# Patient Record
Sex: Female | Born: 1955 | Race: White | Hispanic: Yes | Marital: Married | Smoking: Never smoker
Health system: Southern US, Community
[De-identification: ages and names within clinical notes are randomized; demographics above are authoritative.]

## PROBLEM LIST (undated history)

## (undated) DIAGNOSIS — E039 Hypothyroidism, unspecified: Secondary | ICD-10-CM

## (undated) DIAGNOSIS — K635 Polyp of colon: Secondary | ICD-10-CM

## (undated) DIAGNOSIS — C55 Malignant neoplasm of uterus, part unspecified: Secondary | ICD-10-CM

## (undated) DIAGNOSIS — D126 Benign neoplasm of colon, unspecified: Secondary | ICD-10-CM

## (undated) DIAGNOSIS — Z5189 Encounter for other specified aftercare: Secondary | ICD-10-CM

## (undated) DIAGNOSIS — M1990 Unspecified osteoarthritis, unspecified site: Secondary | ICD-10-CM

## (undated) HISTORY — PX: ABDOMINAL HYSTERECTOMY: SHX81

## (undated) HISTORY — DX: Unspecified osteoarthritis, unspecified site: M19.90

## (undated) HISTORY — DX: Malignant neoplasm of uterus, part unspecified: C55

## (undated) HISTORY — DX: Hypothyroidism, unspecified: E03.9

## (undated) HISTORY — DX: Polyp of colon: K63.5

## (undated) HISTORY — DX: Encounter for other specified aftercare: Z51.89

## (undated) HISTORY — DX: Benign neoplasm of colon, unspecified: D12.6

---

## 1981-06-27 HISTORY — PX: OTHER SURGICAL HISTORY: SHX169

## 2015-12-25 HISTORY — PX: COLONOSCOPY: SHX174

## 2019-08-16 ENCOUNTER — Other Ambulatory Visit: Payer: Self-pay

## 2019-08-16 ENCOUNTER — Encounter: Payer: Self-pay | Admitting: Family Medicine

## 2019-08-16 ENCOUNTER — Ambulatory Visit (INDEPENDENT_AMBULATORY_CARE_PROVIDER_SITE_OTHER): Payer: BC Managed Care – PPO | Admitting: Family Medicine

## 2019-08-16 VITALS — BP 130/80 | HR 78 | Temp 97.3°F | Ht 64.0 in | Wt 187.8 lb

## 2019-08-16 DIAGNOSIS — Z114 Encounter for screening for human immunodeficiency virus [HIV]: Secondary | ICD-10-CM

## 2019-08-16 DIAGNOSIS — Z8542 Personal history of malignant neoplasm of other parts of uterus: Secondary | ICD-10-CM | POA: Insufficient documentation

## 2019-08-16 DIAGNOSIS — Z0000 Encounter for general adult medical examination without abnormal findings: Secondary | ICD-10-CM | POA: Diagnosis not present

## 2019-08-16 DIAGNOSIS — E038 Other specified hypothyroidism: Secondary | ICD-10-CM

## 2019-08-16 DIAGNOSIS — E039 Hypothyroidism, unspecified: Secondary | ICD-10-CM | POA: Insufficient documentation

## 2019-08-16 DIAGNOSIS — Z1159 Encounter for screening for other viral diseases: Secondary | ICD-10-CM

## 2019-08-16 LAB — COMPREHENSIVE METABOLIC PANEL
ALT: 25 U/L (ref 0–35)
AST: 24 U/L (ref 0–37)
Albumin: 4.6 g/dL (ref 3.5–5.2)
Alkaline Phosphatase: 111 U/L (ref 39–117)
BUN: 17 mg/dL (ref 6–23)
CO2: 28 mEq/L (ref 19–32)
Calcium: 10 mg/dL (ref 8.4–10.5)
Chloride: 103 mEq/L (ref 96–112)
Creatinine, Ser: 0.88 mg/dL (ref 0.40–1.20)
GFR: 65.22 mL/min (ref >60.00)
Glucose, Bld: 94 mg/dL (ref 70–99)
Potassium: 3.9 mEq/L (ref 3.5–5.1)
Sodium: 140 mEq/L (ref 135–145)
Total Bilirubin: 0.6 mg/dL (ref 0.2–1.2)
Total Protein: 7.3 g/dL (ref 6.0–8.3)

## 2019-08-16 LAB — CBC WITH DIFFERENTIAL/PLATELET
Basophils Absolute: 0.1 10*3/uL (ref 0.0–0.1)
Basophils Relative: 0.7 % (ref 0.0–3.0)
Eosinophils Absolute: 0.1 10*3/uL (ref 0.0–0.7)
Eosinophils Relative: 1.3 % (ref 0.0–5.0)
HCT: 45.2 % (ref 36.0–46.0)
Hemoglobin: 14.8 g/dL (ref 12.0–15.0)
Lymphocytes Relative: 25.4 % (ref 12.0–46.0)
Lymphs Abs: 2.3 10*3/uL (ref 0.7–4.0)
MCHC: 32.8 g/dL (ref 30.0–36.0)
MCV: 89.2 fl (ref 78.0–100.0)
Monocytes Absolute: 0.6 10*3/uL (ref 0.1–1.0)
Monocytes Relative: 6.3 % (ref 3.0–12.0)
Neutro Abs: 6.1 10*3/uL (ref 1.4–7.7)
Neutrophils Relative %: 66.3 % (ref 43.0–77.0)
Platelets: 336 10*3/uL (ref 150.0–400.0)
RBC: 5.07 Mil/uL (ref 3.87–5.11)
RDW: 13.4 % (ref 11.5–15.5)
WBC: 9.2 10*3/uL (ref 4.0–10.5)

## 2019-08-16 LAB — VITAMIN D 25 HYDROXY (VIT D DEFICIENCY, FRACTURES): VITD: 35.96 ng/mL (ref 30.00–100.00)

## 2019-08-16 LAB — LIPID PANEL
Cholesterol: 244 mg/dL — ABNORMAL HIGH (ref 0–200)
HDL: 75.7 mg/dL (ref >39.00)
NonHDL: 167.8
Total CHOL/HDL Ratio: 3
Triglycerides: 224 mg/dL — ABNORMAL HIGH (ref 0.0–149.0)
VLDL: 44.8 mg/dL — ABNORMAL HIGH (ref 0.0–40.0)

## 2019-08-16 LAB — LDL CHOLESTEROL, DIRECT: Direct LDL: 130 mg/dL

## 2019-08-16 LAB — T4, FREE: Free T4: 1.16 ng/dL (ref 0.60–1.60)

## 2019-08-16 LAB — TSH: TSH: 1.57 u[IU]/mL (ref 0.35–4.50)

## 2019-08-16 NOTE — Progress Notes (Signed)
Patient: Wendy Payne MRN: [id number] DOB: 07-02-1955 PCP: No primary care provider on file.    Subjective:  Chief Complaint  Patient presents with  . Establish Care    HPI: The patient is a 64 y.o. female who presents today for establishing care. Has past medical history of hypothyroidism and uterine cancer s/p hysterectomy and oophorectomy.  The patient is a 64 year old female who presents today for annual exam. She denies any changes to past medical history. There have been no recent hospitalizations. They are following a well balanced diet and exercise plan. Weight has been stable. No complaints today.   Colonoscopy: 12/25/2015 Mammogram: 08/2018 Pap smear: N/A  Father had colon cancer diagnosed at age 41 years. She is on q 45 years screening. Last cscope was 12/25/2015.   Hypothyroidism: diagnosed at age 4 years. She overall feels good. No weight gain, swelling, dry skin, constipation, problems swallowing. She does have cold intolerance. Will check today. Last checked about one year ago.   Review of Systems  Constitutional: Negative for chills, fatigue and fever.  HENT: Negative for dental problem, ear pain, hearing loss, sinus pressure, sinus pain, sore throat and trouble swallowing.   Eyes: Negative for visual disturbance.  Respiratory: Negative for cough, chest tightness and shortness of breath.   Cardiovascular: Negative for chest pain, palpitations and leg swelling.  Gastrointestinal: Negative for abdominal pain, blood in stool, diarrhea and nausea.  Endocrine: Negative for cold intolerance, polydipsia, polyphagia and polyuria.  Genitourinary: Negative for dysuria, flank pain, frequency, hematuria and urgency.  Musculoskeletal: Negative for arthralgias.  Skin: Negative for color change, pallor and rash.  Neurological: Negative for dizziness, light-headedness and headaches.  Psychiatric/Behavioral: Negative for dysphoric mood and sleep disturbance. The patient is not  nervous/anxious.     Allergies Patient has No Known Allergies.  Past Medical History Patient  has a past medical history of Hypothyroidism and Uterine cancer ([place]).  Surgical History Patient  has a past surgical history that includes hysterectomy and oopherectomy  (Bilateral).  Family History Pateint's family history is not on file.  Social History Patient  reports that she has never smoked. She has never used smokeless tobacco. She reports that she does not drink alcohol or use drugs.    Objective: Vitals:   08/16/19 1003  BP: 130/80  Pulse: 78  Temp: (!) 97.3 F (36.3 C)  TempSrc: Temporal  SpO2: 95%  Weight: 187 lb 12.8 oz (85.2 kg)  Height: 5\' 4"  (1.626 m)    Body mass index is 32.24 kg/m.  Physical Exam Vitals reviewed.  Constitutional:      Appearance: Normal appearance. She is well-developed. She is obese.  HENT:     Head: Normocephalic and atraumatic.     Right Ear: Tympanic membrane, ear canal and external ear normal.     Left Ear: Tympanic membrane, ear canal and external ear normal.     Nose: Nose normal.     Mouth/Throat:     Mouth: Mucous membranes are moist.  Eyes:     Extraocular Movements: Extraocular movements intact.     Conjunctiva/sclera: Conjunctivae normal.     Pupils: Pupils are equal, round, and reactive to light.  Neck:     Thyroid: No thyromegaly.     Vascular: No carotid bruit.  Cardiovascular:     Rate and Rhythm: Normal rate and regular rhythm.     Pulses: Normal pulses.     Heart sounds: Normal heart sounds. No murmur.  Pulmonary:  Effort: Pulmonary effort is normal.     Breath sounds: Normal breath sounds.  Abdominal:     General: Abdomen is flat. Bowel sounds are normal. There is no distension.     Palpations: Abdomen is soft.     Tenderness: There is no abdominal tenderness.  Musculoskeletal:        General: No swelling. Normal range of motion.     Cervical back: Normal range of motion and neck supple.   Lymphadenopathy:     Cervical: No cervical adenopathy.  Skin:    General: Skin is warm and dry.     Capillary Refill: Capillary refill takes less than 2 seconds.     Findings: No rash.  Neurological:     General: No focal deficit present.     Mental Status: She is alert and oriented to person, place, and time.     Cranial Nerves: No cranial nerve deficit.     Coordination: Coordination normal.     Deep Tendon Reflexes: Reflexes normal.  Psychiatric:        Mood and Affect: Mood normal.        Behavior: Behavior normal.      Office Visit from 08/16/2019 in [facility]  PHQ-2 Total Score  0         Assessment/plan: 1. Annual physical exam Routine labs today, she is not fasting, but fine to do. UTD on HM except for mmg and information given on this today. Lots of cancer in family and discussed genetic counseling. She may consider this summer. Encouraged exercise, weight loss. Doing well. F/u in one year or as needed.  Patient counseling [x]    Nutrition: Stressed importance of moderation in sodium/caffeine intake, saturated fat and cholesterol, caloric balance, sufficient intake of fresh fruits, vegetables, fiber, calcium, iron, and 1 mg of folate supplement per day (for females capable of pregnancy).  [x]    Stressed the importance of regular exercise.   []    Substance Abuse: Discussed cessation/primary prevention of tobacco, alcohol, or other drug use; driving or other dangerous activities under the influence; availability of treatment for abuse.   [x]    Injury prevention: Discussed safety belts, safety helmets, smoke detector, smoking near bedding or upholstery.   [x]    Sexuality: Discussed sexually transmitted diseases, partner selection, use of condoms, avoidance of unintended pregnancy  and contraceptive alternatives.  [x]    Dental health: Discussed importance of regular tooth brushing, flossing, and dental visits.  [x]    Health maintenance and immunizations  reviewed. Please refer to Health maintenance section.    - Comprehensive metabolic panel - CBC with Differential/Platelet - VITAMIN D 25 Hydroxy (Vit-D Deficiency, Fractures) - Lipid panel  2. Other specified hypothyroidism Will refill meds after labs are back.  - T4, free - TSH  3. Encounter for screening for HIV  - HIV Antibody (routine testing w rflx)  4. Encounter for hepatitis C screening test for low risk patient  - Hepatitis C antibody   This visit occurred during the SARS-CoV-2 public health emergency.  Safety protocols were in place, including screening questions prior to the visit, additional usage of staff PPE, and extensive cleaning of exam room while observing appropriate contact time as indicated for disinfecting solutions.    Return in about 1 year (around 08/15/2020).   Orma Flaming, MD [place]    08/16/2019

## 2019-08-16 NOTE — Patient Instructions (Signed)

## 2019-08-19 ENCOUNTER — Encounter: Payer: Self-pay | Admitting: Family Medicine

## 2019-08-19 ENCOUNTER — Other Ambulatory Visit: Payer: Self-pay | Admitting: Family Medicine

## 2019-08-19 ENCOUNTER — Telehealth: Payer: Self-pay | Admitting: Family Medicine

## 2019-08-19 DIAGNOSIS — E785 Hyperlipidemia, unspecified: Secondary | ICD-10-CM | POA: Insufficient documentation

## 2019-08-19 LAB — HEPATITIS C ANTIBODY
Hepatitis C Ab: NONREACTIVE
SIGNAL TO CUT-OFF: 0.03 (ref <1.00)

## 2019-08-19 LAB — HIV ANTIBODY (ROUTINE TESTING W REFLEX): HIV 1&2 Ab, 4th Generation: NONREACTIVE

## 2019-08-19 MED ORDER — LEVOTHYROXINE SODIUM 112 MCG PO TABS: 112.0000 ug | ORAL_TABLET | Freq: Every day | ORAL | 3 refills | Status: DC

## 2019-08-19 NOTE — Telephone Encounter (Signed)
Patient calling back about lab results.  °

## 2019-08-20 ENCOUNTER — Encounter: Payer: Self-pay | Admitting: Emergency Medicine

## 2019-08-20 NOTE — Telephone Encounter (Signed)
Spoke with pt to give results.

## 2019-08-21 ENCOUNTER — Ambulatory Visit: Payer: Self-pay | Admitting: Family Medicine

## 2019-08-26 ENCOUNTER — Encounter: Payer: Self-pay | Admitting: Family Medicine

## 2019-10-03 ENCOUNTER — Ambulatory Visit: Payer: BC Managed Care – PPO | Attending: Internal Medicine

## 2019-10-03 DIAGNOSIS — Z23 Encounter for immunization: Secondary | ICD-10-CM

## 2019-10-03 NOTE — Progress Notes (Signed)
   Covid-19 Vaccination Clinic  Name:  Wendy Payne    MRN: [id number] DOB: 15-Mar-1956  10/03/2019  [name] was observed post Covid-19 immunization for 15 minutes without incident. She was provided with Vaccine Information Sheet and instruction to access the V-Safe system.   [name] was instructed to call 911 with any severe reactions post vaccine: Marland Kitchen Difficulty breathing  . Swelling of face and throat  . A fast heartbeat  . A bad rash all over body  . Dizziness and weakness   Immunizations Administered    Name Date Dose VIS Date Route   Pfizer COVID-19 Vaccine 10/03/2019  2:01 PM 0.3 mL 06/07/2019 Intramuscular   Manufacturer: [place]   Lot: [id number]   [place]: [id number]

## 2019-10-28 ENCOUNTER — Ambulatory Visit: Payer: BC Managed Care – PPO | Attending: Internal Medicine

## 2019-10-28 DIAGNOSIS — Z23 Encounter for immunization: Secondary | ICD-10-CM

## 2019-10-28 NOTE — Progress Notes (Signed)
   Covid-19 Vaccination Clinic  Name:  Wendy Payne    MRN: [id number] DOB: November 29, 1955  10/28/2019  Ms. Money was observed post Covid-19 immunization for 15 minutes without incident. She was provided with Vaccine Information Sheet and instruction to access the V-Safe system.   [name] was instructed to call 911 with any severe reactions post vaccine: Marland Kitchen Difficulty breathing  . Swelling of face and throat  . A fast heartbeat  . A bad rash all over body  . Dizziness and weakness   Immunizations Administered    Name Date Dose VIS Date Route   Pfizer COVID-19 Vaccine 10/28/2019 10:42 AM 0.3 mL 08/21/2018 Intramuscular   Manufacturer: [place]   Lot: [id number]   [place]: [id number]

## 2020-01-28 ENCOUNTER — Other Ambulatory Visit: Payer: Self-pay

## 2020-01-28 ENCOUNTER — Telehealth: Payer: Self-pay | Admitting: Family Medicine

## 2020-01-28 NOTE — Telephone Encounter (Signed)
Levothyroxine is the generic correct?    Refill?

## 2020-01-28 NOTE — Telephone Encounter (Signed)
Patient is asking for a generic version of her levothyroxine (SYNTHROID) 112 MCG tablet, states that its too expensive -[facility]: [facility] :Address: [address], [place], [place] [address], Phone: [phone]

## 2020-01-29 NOTE — Telephone Encounter (Signed)
Lvm for pt to cal the office back.

## 2020-01-29 NOTE — Telephone Encounter (Signed)
Levothyroxine is the generic. She may need to try goodrx or ask pharmacist if there is something cheaper. That is the only generic that I am aware of.   Dr. Rogers Blocker

## 2020-01-31 MED ORDER — LEVOTHYROXINE SODIUM 112 MCG PO TABS: 112.0000 ug | ORAL_TABLET | Freq: Every day | ORAL | 3 refills | Status: DC

## 2020-01-31 NOTE — Telephone Encounter (Signed)
levothyroxine (SYNTHROID) 112 MCG tablet [[id number]]   Can this be sent to the [facility] on [address] please?

## 2020-01-31 NOTE — Addendum Note (Signed)
Addended by: Orma Flaming on: 01/31/2020 02:07 PM   Modules accepted: Orders

## 2020-01-31 NOTE — Telephone Encounter (Signed)
Relayed Dr. Shelby Mattocks message to PT. She is going to go pick up prescription now from pharmacy.

## 2020-02-12 ENCOUNTER — Encounter: Payer: Self-pay | Admitting: Family Medicine

## 2020-02-12 MED ORDER — LEVOTHYROXINE SODIUM 112 MCG PO TABS: 112.0000 ug | ORAL_TABLET | Freq: Every day | ORAL | 3 refills | Status: DC

## 2020-02-12 NOTE — Telephone Encounter (Signed)
FYI

## 2020-04-24 ENCOUNTER — Other Ambulatory Visit: Payer: Self-pay | Admitting: Family Medicine

## 2020-04-24 MED ORDER — LEVOTHYROXINE SODIUM 112 MCG PO TABS: 112.0000 ug | ORAL_TABLET | Freq: Every day | ORAL | 3 refills | Status: DC

## 2020-05-25 ENCOUNTER — Other Ambulatory Visit: Payer: Self-pay | Admitting: Family Medicine

## 2020-05-25 DIAGNOSIS — Z1231 Encounter for screening mammogram for malignant neoplasm of breast: Secondary | ICD-10-CM

## 2020-07-07 ENCOUNTER — Ambulatory Visit: Payer: BC Managed Care – PPO

## 2020-08-11 ENCOUNTER — Other Ambulatory Visit: Payer: Self-pay

## 2020-08-11 ENCOUNTER — Ambulatory Visit
Admission: RE | Admit: 2020-08-11 | Discharge: 2020-08-11 | Disposition: A | Discharge status: Home / Self Care | Payer: BC Managed Care – PPO | Source: Ambulatory Visit | Attending: Family Medicine | Admitting: Family Medicine

## 2020-08-11 DIAGNOSIS — Z1231 Encounter for screening mammogram for malignant neoplasm of breast: Secondary | ICD-10-CM

## 2020-08-17 ENCOUNTER — Encounter: Payer: Self-pay | Admitting: Family Medicine

## 2020-08-17 ENCOUNTER — Ambulatory Visit (INDEPENDENT_AMBULATORY_CARE_PROVIDER_SITE_OTHER): Payer: BC Managed Care – PPO | Admitting: Family Medicine

## 2020-08-17 ENCOUNTER — Other Ambulatory Visit: Payer: Self-pay

## 2020-08-17 VITALS — BP 108/72 | HR 67 | Temp 97.9°F | Ht 64.0 in | Wt 188.6 lb

## 2020-08-17 DIAGNOSIS — E038 Other specified hypothyroidism: Secondary | ICD-10-CM

## 2020-08-17 DIAGNOSIS — Z0000 Encounter for general adult medical examination without abnormal findings: Secondary | ICD-10-CM | POA: Diagnosis not present

## 2020-08-17 DIAGNOSIS — Z800 Family history of malignant neoplasm of digestive organs: Secondary | ICD-10-CM

## 2020-08-17 DIAGNOSIS — E782 Mixed hyperlipidemia: Secondary | ICD-10-CM | POA: Diagnosis not present

## 2020-08-17 LAB — CBC WITH DIFFERENTIAL/PLATELET
Basophils Absolute: 0.1 10*3/uL (ref 0.0–0.1)
Basophils Relative: 0.6 % (ref 0.0–3.0)
Eosinophils Absolute: 0.1 10*3/uL (ref 0.0–0.7)
Eosinophils Relative: 1.1 % (ref 0.0–5.0)
HCT: 44 % (ref 36.0–46.0)
Hemoglobin: 14.8 g/dL (ref 12.0–15.0)
Lymphocytes Relative: 23.9 % (ref 12.0–46.0)
Lymphs Abs: 2.3 10*3/uL (ref 0.7–4.0)
MCHC: 33.7 g/dL (ref 30.0–36.0)
MCV: 87.5 fl (ref 78.0–100.0)
Monocytes Absolute: 0.6 10*3/uL (ref 0.1–1.0)
Monocytes Relative: 6.8 % (ref 3.0–12.0)
Neutro Abs: 6.4 10*3/uL (ref 1.4–7.7)
Neutrophils Relative %: 67.6 % (ref 43.0–77.0)
Platelets: 306 10*3/uL (ref 150.0–400.0)
RBC: 5.03 Mil/uL (ref 3.87–5.11)
RDW: 13.5 % (ref 11.5–15.5)
WBC: 9.5 10*3/uL (ref 4.0–10.5)

## 2020-08-17 LAB — TSH: TSH: 4.03 u[IU]/mL (ref 0.35–4.50)

## 2020-08-17 LAB — COMPREHENSIVE METABOLIC PANEL
ALT: 18 U/L (ref 0–35)
AST: 21 U/L (ref 0–37)
Albumin: 4.7 g/dL (ref 3.5–5.2)
Alkaline Phosphatase: 94 U/L (ref 39–117)
BUN: 15 mg/dL (ref 6–23)
CO2: 28 mEq/L (ref 19–32)
Calcium: 9.9 mg/dL (ref 8.4–10.5)
Chloride: 102 mEq/L (ref 96–112)
Creatinine, Ser: 0.82 mg/dL (ref 0.40–1.20)
GFR: 76.62 mL/min (ref >60.00)
Glucose, Bld: 95 mg/dL (ref 70–99)
Potassium: 4.5 mEq/L (ref 3.5–5.1)
Sodium: 140 mEq/L (ref 135–145)
Total Bilirubin: 0.6 mg/dL (ref 0.2–1.2)
Total Protein: 7.7 g/dL (ref 6.0–8.3)

## 2020-08-17 LAB — LIPID PANEL
Cholesterol: 225 mg/dL — ABNORMAL HIGH (ref 0–200)
HDL: 72.3 mg/dL (ref >39.00)
LDL Cholesterol: 120 mg/dL — ABNORMAL HIGH (ref 0–99)
NonHDL: 152.21
Total CHOL/HDL Ratio: 3
Triglycerides: 159 mg/dL — ABNORMAL HIGH (ref 0.0–149.0)
VLDL: 31.8 mg/dL (ref 0.0–40.0)

## 2020-08-17 LAB — T4, FREE: Free T4: 0.95 ng/dL (ref 0.60–1.60)

## 2020-08-17 MED ORDER — PHENTERMINE HCL 37.5 MG PO CAPS: 37.5000 mg | ORAL_CAPSULE | ORAL | 1 refills | Status: DC

## 2020-08-17 NOTE — Progress Notes (Signed)
vb v Patient: Wendy Payne MRN: [id number] DOB: [birth year] PCP: Orma Flaming, MD     Subjective:  Chief Complaint  Patient presents with  . Annual Exam  . Hyperlipidemia  . Hypothyroidism    HPI: The patient is a 65 y.o. female who presents today for annual exam. She denies any changes to past medical history. There have been no recent hospitalizations. They are following a well balanced diet and exercise plan. Weight has been increasing steadily. She says that [facility] just joined [facility] [employer], and [facility] 5 days weekly. She would like to discuss Cholesterol levels. She is also fasting.   She has been trying to lose weight and was on phentermine for a short period of time. She is interested in getting back on this for a short amount of time. She had no side effects on it at all.   Hyperlipidemia -she has no family hx of heart attack or stroke. She does not smoke or drink. She is very fit and healthy. ASCVD risk was 3.5%. Her HDL was 75. Discussed treatment guidelines.   Immunization History  Administered Date(s) Administered  . PFIZER(Purple Top)SARS-COV-2 Vaccination 10/03/2019, 10/28/2019, 06/11/2020   Colonoscopy: 12/25/2015. Due this year.  Mammogram: 08/11/2020 Pap smear: NA  Tdap: she thinks within 10 years.   Review of Systems  Constitutional: Negative for chills, fatigue and fever.  HENT: Negative for dental problem, ear pain, hearing loss, sore throat and trouble swallowing.   Eyes: Negative for visual disturbance.  Respiratory: Negative for cough, chest tightness and shortness of breath.   Cardiovascular: Negative for chest pain, palpitations and leg swelling.  Gastrointestinal: Negative for abdominal pain, blood in stool, diarrhea and nausea.  Endocrine: Negative for cold intolerance, polydipsia, polyphagia and polyuria.  Genitourinary: Negative for dysuria and hematuria.  Musculoskeletal: Negative for arthralgias.  Skin: Negative for rash.  Neurological: Negative for  dizziness and headaches.  Psychiatric/Behavioral: Negative for dysphoric mood and sleep disturbance. The patient is not nervous/anxious.     Allergies Patient has No Known Allergies.  Past Medical History Patient  has a past medical history of Colon polyps, Hypothyroidism, and Uterine cancer ([place]).  Surgical History Patient  has a past surgical history that includes hysterectomy and oopherectomy  (Bilateral, 1983).  Family History Pateint's family history includes BRCA 1/2 in her cousin and cousin; Cancer in her father and mother; Kidney disease in her mother.  Social History Patient  reports that she has never smoked. She has never used smokeless tobacco. She reports that she does not drink alcohol and does not use drugs.    Objective: Vitals:   08/17/20 1000  BP: 108/72  Pulse: 67  Temp: 97.9 F (36.6 C)  TempSrc: Temporal  SpO2: 97%  Weight: 188 lb 9.6 oz (85.5 kg)  Height: '5\' 4"'  (1.626 m)    Body mass index is 32.37 kg/m.  Physical Exam Vitals reviewed.  Constitutional:      Appearance: Normal appearance. She is well-developed and well-nourished. She is obese.  HENT:     Head: Normocephalic and atraumatic.     Right Ear: Tympanic membrane, ear canal and external ear normal.     Left Ear: Tympanic membrane, ear canal and external ear normal.     Nose: Nose normal.     Mouth/Throat:     Mouth: Oropharynx is clear and moist. Mucous membranes are moist.  Eyes:     Extraocular Movements: EOM normal.     Conjunctiva/sclera: Conjunctivae normal.     Pupils: Pupils are  equal, round, and reactive to light.  Neck:     Thyroid: No thyromegaly.  Cardiovascular:     Rate and Rhythm: Normal rate and regular rhythm.     Pulses: Intact distal pulses.     Heart sounds: Normal heart sounds. No murmur heard.   Pulmonary:     Effort: Pulmonary effort is normal.     Breath sounds: Normal breath sounds.  Abdominal:     General: Bowel sounds are normal. There is no  distension.     Palpations: Abdomen is soft.     Tenderness: There is no abdominal tenderness.  Musculoskeletal:     Cervical back: Normal range of motion and neck supple.  Lymphadenopathy:     Cervical: No cervical adenopathy.  Skin:    General: Skin is warm and dry.     Capillary Refill: Capillary refill takes less than 2 seconds.     Findings: No rash.  Neurological:     General: No focal deficit present.     Mental Status: She is alert and oriented to person, place, and time.     Cranial Nerves: No cranial nerve deficit.     Coordination: Coordination normal.     Deep Tendon Reflexes: Reflexes normal.  Psychiatric:        Mood and Affect: Mood and affect and mood normal.        Behavior: Behavior normal.    [facility] Office Visit from 08/17/2020 in [facility]  PHQ-2 Total Score 0         Assessment/plan: 1. Annual physical exam Routine fasting labs today. She is UTD on her HM. Asked her to find tdap vaccine. She is very healthy and working - CBC with Differential/Platelet - Comprehensive metabolic panel  2. Mixed hyperlipidemia ascvd risk was 3.5% last year. Discussed guidelines and will check to see where she is out. Doubt she will need medication and she would like to stay off if possible.  - Lipid panel  3. Other specified hypothyroidism  - T4, free - TSH  4. Family history of colon cancer in father Colonoscopies q 5 years. Due this summer so referral done.  - Ambulatory referral to Gastroenterology  Will do phentermine for her. No contraindications. Has tolerated well in the past. No longer than 6 months.   This visit occurred during the SARS-CoV-2 public health emergency.  Safety protocols were in place, including screening questions prior to the visit, additional usage of staff PPE, and extensive cleaning of exam room while observing appropriate contact time as indicated for disinfecting solutions.     Return in about 1 year  (around 08/17/2021) for annual/thyroid/chol .     Orma Flaming, MD [place]  08/17/2020

## 2020-08-17 NOTE — Patient Instructions (Signed)
-look for tdap shot for me. Just need date.  -sent in phenteramine for you to take. No longer than 6 months.  -routine fasting labs -you look great! So good to see you!     Preventive Care 52-63 Years Old, Female Preventive care refers to lifestyle choices and visits with your health care provider that can promote health and wellness. This includes:  A yearly physical exam. This is also called an annual wellness visit.  Regular dental and eye exams.  Immunizations.  Screening for certain conditions.  Healthy lifestyle choices, such as: ? Eating a healthy diet. ? Getting regular exercise. ? Not using drugs or products that contain nicotine and tobacco. ? Limiting alcohol use. What can I expect for my preventive care visit? Physical exam Your health care provider will check your:  Height and weight. These may be used to calculate your BMI (body mass index). BMI is a measurement that tells if you are at a healthy weight.  Heart rate and blood pressure.  Body temperature.  Skin for abnormal spots. Counseling Your health care provider may ask you questions about your:  Past medical problems.  Family's medical history.  Alcohol, tobacco, and drug use.  Emotional well-being.  Home life and relationship well-being.  Sexual activity.  Diet, exercise, and sleep habits.  Work and work Statistician.  Access to firearms.  Method of birth control.  Menstrual cycle.  Pregnancy history. What immunizations do I need? Vaccines are usually given at various ages, according to a schedule. Your health care provider will recommend vaccines for you based on your age, medical history, and lifestyle or other factors, such as travel or where you work.   What tests do I need? Blood tests  Lipid and cholesterol levels. These may be checked every 5 years, or more often if you are over 44 years old.  Hepatitis C test.  Hepatitis B test. Screening  Lung cancer screening. You  may have this screening every year starting at age 52 if you have a 30-pack-year history of smoking and currently smoke or have quit within the past 15 years.  Colorectal cancer screening. ? All adults should have this screening starting at age 56 and continuing until age 19. ? Your health care provider may recommend screening at age 62 if you are at increased risk. ? You will have tests every 1-10 years, depending on your results and the type of screening test.  Diabetes screening. ? This is done by checking your blood sugar (glucose) after you have not eaten for a while (fasting). ? You may have this done every 1-3 years.  Mammogram. ? This may be done every 1-2 years. ? Talk with your health care provider about when you should start having regular mammograms. This may depend on whether you have a family history of breast cancer.  BRCA-related cancer screening. This may be done if you have a family history of breast, ovarian, tubal, or peritoneal cancers.  Pelvic exam and Pap test. ? This may be done every 3 years starting at age 40. ? Starting at age 34, this may be done every 5 years if you have a Pap test in combination with an HPV test. Other tests  STD (sexually transmitted disease) testing, if you are at risk.  Bone density scan. This is done to screen for osteoporosis. You may have this scan if you are at high risk for osteoporosis. Talk with your health care provider about your test results, treatment options, and if  necessary, the need for more tests. Follow these instructions at home: Eating and drinking  Eat a diet that includes fresh fruits and vegetables, whole grains, lean protein, and low-fat dairy products.  Take vitamin and mineral supplements as recommended by your health care provider.  Do not drink alcohol if: ? Your health care provider tells you not to drink. ? You are pregnant, may be pregnant, or are planning to become pregnant.  If you drink  alcohol: ? Limit how much you have to 0-1 drink a day. ? Be aware of how much alcohol is in your drink. In the [place] one drink equals one 12 oz bottle of beer (355 mL), one 5 oz glass of wine (148 mL), or one 1 oz glass of hard liquor (44 mL).   Lifestyle  Take daily care of your teeth and gums. Brush your teeth every morning and night with fluoride toothpaste. Floss one time each day.  Stay active. Exercise for at least 30 minutes 5 or more days each week.  Do not use any products that contain nicotine or tobacco, such as cigarettes, e-cigarettes, and chewing tobacco. If you need help quitting, ask your health care provider.  Do not use drugs.  If you are sexually active, practice safe sex. Use a condom or other form of protection to prevent STIs (sexually transmitted infections).  If you do not wish to become pregnant, use a form of birth control. If you plan to become pregnant, see your health care provider for a prepregnancy visit.  If told by your health care provider, take low-dose aspirin daily starting at age 20.  Find healthy ways to cope with stress, such as: ? Meditation, yoga, or listening to music. ? Journaling. ? Talking to a trusted person. ? Spending time with friends and family. Safety  Always wear your seat belt while driving or riding in a vehicle.  Do not drive: ? If you have been drinking alcohol. Do not ride with someone who has been drinking. ? When you are tired or distracted. ? While texting.  Wear a helmet and other protective equipment during sports activities.  If you have firearms in your house, make sure you follow all gun safety procedures. What's next?  Visit your health care provider once a year for an annual wellness visit.  Ask your health care provider how often you should have your eyes and teeth checked.  Stay up to date on all vaccines. This information is not intended to replace advice given to you by your health care provider. Make  sure you discuss any questions you have with your health care provider. Document Revised: 03/17/2020 Document Reviewed: 02/22/2018 Elsevier Patient Education  2021 [employer].

## 2020-08-19 ENCOUNTER — Telehealth: Payer: Self-pay

## 2020-08-19 MED ORDER — PHENTERMINE HCL 37.5 MG PO TABS: 37.5000 mg | ORAL_TABLET | Freq: Every day | ORAL | 1 refills | Status: DC

## 2020-08-19 NOTE — Telephone Encounter (Signed)
Sent in tablets.  Orma Flaming, MD [place]

## 2020-08-19 NOTE — Telephone Encounter (Signed)
Patient is requesting pills instead of capsules regarding phentermine 37.5 MG capsule can we send in RX for pills instead of capsules  Please advise  tHank you

## 2020-08-28 ENCOUNTER — Telehealth: Payer: Self-pay | Admitting: Gastroenterology

## 2020-08-28 NOTE — Telephone Encounter (Signed)
Hi Dr. Ardis Hughs, we have received a referral from patient's PCP for a repeat colon. Patient had colon in 2017. Records have been received and they will be sent to you for review. Please advise on scheduling. Thank you.

## 2020-08-31 NOTE — Telephone Encounter (Signed)
I reviewed outside imaging.  Colonoscopy June 2017, Dr. Jannetta Quint in [place].  Indication "colon cancer in father before age 63, personal history of colon polyps, unable to locate last colonoscopy report".  Findings 3 mm polyp in the cecum was found and removed with forceps.  The examination was otherwise normal.  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   Can you please let her know that I reviewed her 2017 colonoscopy report and given her family history of colon cancer she needs repeat colonoscopy June 2022.

## 2020-08-31 NOTE — Telephone Encounter (Signed)
Called pt to inform. She will contact us in April to schedule for June procedure.

## 2020-09-29 ENCOUNTER — Encounter: Payer: Self-pay | Admitting: Gastroenterology

## 2020-10-26 ENCOUNTER — Encounter: Payer: Self-pay | Admitting: Family Medicine

## 2020-10-29 MED ORDER — PHENTERMINE HCL 37.5 MG PO TABS: 37.5000 mg | ORAL_TABLET | Freq: Every day | ORAL | 1 refills | Status: DC

## 2020-10-29 NOTE — Telephone Encounter (Signed)
Pt called following up on this message. Please advise.  

## 2020-12-22 ENCOUNTER — Encounter: Payer: BC Managed Care – PPO | Admitting: Gastroenterology

## 2021-02-28 ENCOUNTER — Other Ambulatory Visit: Payer: Self-pay | Admitting: Family Medicine

## 2021-03-08 ENCOUNTER — Other Ambulatory Visit: Payer: Self-pay

## 2021-03-08 ENCOUNTER — Ambulatory Visit (AMBULATORY_SURGERY_CENTER): Payer: Self-pay | Admitting: *Deleted

## 2021-03-08 VITALS — Ht 64.0 in | Wt 180.0 lb

## 2021-03-08 DIAGNOSIS — Z86010 Personal history of colonic polyps: Secondary | ICD-10-CM

## 2021-03-08 DIAGNOSIS — Z800 Family history of malignant neoplasm of digestive organs: Secondary | ICD-10-CM

## 2021-03-08 MED ORDER — NA SULFATE-K SULFATE-MG SULF 17.5-3.13-1.6 GM/177ML PO SOLN: 1.0000 | ORAL | 0 refills | Status: DC

## 2021-03-08 NOTE — Progress Notes (Signed)
Patient is here in-person for PV. Patient denies any allergies to eggs or soy. Patient denies any problems with anesthesia/sedation. Patient is not on any oxygen at home. Patient is not taking any diet/weight loss medications or blood thinners. Patient is aware of our care-partner policy and 0000000 safety protocol.   EMMI education assigned to the patient for the procedure, sent to [facility].   Patient is COVID-19 vaccinated. Patient has constipation-she will use miralax daily and the suprep-pt aware of the cost.

## 2021-03-19 ENCOUNTER — Other Ambulatory Visit: Payer: Self-pay

## 2021-03-19 ENCOUNTER — Encounter: Payer: Self-pay | Admitting: Gastroenterology

## 2021-03-19 ENCOUNTER — Ambulatory Visit (AMBULATORY_SURGERY_CENTER): Payer: BC Managed Care – PPO | Admitting: Gastroenterology

## 2021-03-19 VITALS — BP 128/62 | HR 60 | Temp 97.8°F | Resp 15 | Ht 64.0 in | Wt 180.0 lb

## 2021-03-19 DIAGNOSIS — D124 Benign neoplasm of descending colon: Secondary | ICD-10-CM | POA: Diagnosis not present

## 2021-03-19 DIAGNOSIS — Z800 Family history of malignant neoplasm of digestive organs: Secondary | ICD-10-CM

## 2021-03-19 DIAGNOSIS — D123 Benign neoplasm of transverse colon: Secondary | ICD-10-CM

## 2021-03-19 DIAGNOSIS — Z86010 Personal history of colonic polyps: Secondary | ICD-10-CM | POA: Diagnosis present

## 2021-03-19 MED ORDER — SODIUM CHLORIDE 0.9 % IV SOLN
500.0000 mL | Freq: Once | INTRAVENOUS | Status: DC
Start: 2021-03-19 — End: 2023-05-29

## 2021-03-19 NOTE — Op Note (Signed)
[place] Patient Name: Wendy Payne Procedure Date: 03/19/2021 8:46 AM MRN: [id number] Endoscopist: Milus Banister , MD Age: 65 Referring MD:  Date of Birth: Feb 09, 1956 Gender: Female Account #: [id number] Procedure:                Colonoscopy Indications:              High risk colon cancer surveillance: Personal                            history of colonic polyps; Father with CRC in his                            66s, niece with CRC; Colonoscopy June 2017, Dr.                            Jannetta Quint in [place].                            Indication "colon cancer in father before age 39,                            personal history of colon polyps, unable to locate                            last colonoscopy report". Findings 3 mm polyp in                            the cecum was found and removed with forceps. The                            examination was otherwise normal. The polyp was                            proven to be a tubular adenoma on pathology without                            high-grade dysplasia. Medicines:                Monitored Anesthesia Care Procedure:                Pre-Anesthesia Assessment:                           - Prior to the procedure, a History and Physical                            was performed, and patient medications and                            allergies were reviewed. The patient's tolerance of                            previous anesthesia was also reviewed. The risks  and benefits of the procedure and the sedation                            options and risks were discussed with the patient.                            All questions were answered, and informed consent                            was obtained. Prior Anticoagulants: The patient has                            taken no previous anticoagulant or antiplatelet                            agents. ASA Grade Assessment: II  - A patient with                            mild systemic disease. After reviewing the risks                            and benefits, the patient was deemed in                            satisfactory condition to undergo the procedure.                           After obtaining informed consent, the colonoscope                            was passed under direct vision. Throughout the                            procedure, the patient's blood pressure, pulse, and                            oxygen saturations were monitored continuously. The                            PCF-HQ190L Colonoscope was introduced through the                            anus and advanced to the the cecum, identified by                            appendiceal orifice and ileocecal valve. The                            colonoscopy was performed without difficulty. The                            patient tolerated the procedure well. The quality  of the bowel preparation was good. The ileocecal                            valve, appendiceal orifice, and rectum were                            photographed. Scope In: 8:51:56 AM Scope Out: 9:05:08 AM Scope Withdrawal Time: 0 hours 10 minutes 30 seconds  Total Procedure Duration: 0 hours 13 minutes 12 seconds  Findings:                 Four sessile polyps were found in the descending                            colon and transverse colon. The polyps were 2 to 4                            mm in size. These polyps were removed with a cold                            snare. Resection and retrieval were complete.                           The exam was otherwise without abnormality on                            direct and retroflexion views. Complications:            No immediate complications. Estimated blood loss:                            None. Estimated Blood Loss:     Estimated blood loss: none. Impression:               - Four 2 to 4 mm polyps in the  descending colon and                            in the transverse colon, removed with a cold snare.                            Resected and retrieved.                           - The examination was otherwise normal on direct                            and retroflexion views. Recommendation:           - Patient has a contact number available for                            emergencies. The signs and symptoms of potential                            delayed complications were discussed with the  patient. Return to normal activities tomorrow.                            Written discharge instructions were provided to the                            patient.                           - Resume previous diet.                           - Continue present medications.                           - Await pathology results. Milus Banister, MD 03/19/2021 9:07:24 AM This report has been signed electronically.

## 2021-03-19 NOTE — Progress Notes (Signed)
HPI: This is a woman with fh of crc, personal h/o polyps  Colonoscopy June 2017, Dr. Jannetta Quint in [place].  Indication "colon cancer in father before age 63, personal history of colon polyps, unable to locate last colonoscopy report".  Findings 3 mm polyp in the cecum was found and removed with forceps.  The examination was otherwise normal.  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   ROS: complete GI ROS as described in HPI, all other review negative.  Constitutional:  No unintentional weight loss   Past Medical History:  Diagnosis Date   Blood transfusion without reported diagnosis    Colon polyps    Hypothyroidism    Uterine cancer [facility])    age 77    Past Surgical History:  Procedure Laterality Date   COLONOSCOPY  12/25/2015   in [place], [place] polyp-adenoma   hysterectomy and oopherectomy  Bilateral 1983   age 29 years.     Current Outpatient Medications  Medication Sig Dispense Refill   ergocalciferol (VITAMIN D2) 1.25 MG (50000 UT) capsule Take 50,000 Units by mouth once a week.     levothyroxine (SYNTHROID) 112 MCG tablet TAKE 1 TABLET BY MOUTH  DAILY BEFORE BREAKFAST 90 tablet 1   phentermine (ADIPEX-P) 37.5 MG tablet Take 1 tablet (37.5 mg total) by mouth daily before breakfast. (Patient not taking: Reported on 03/19/2021) 30 tablet 1   Current Facility-Administered Medications  Medication Dose Route Frequency Provider Last Rate Last Admin   0.9 %  sodium chloride infusion  500 mL Intravenous Once Milus Banister, MD        Allergies as of 03/19/2021   (No Known Allergies)    Family History  Problem Relation Age of Onset   Cancer Mother    Kidney disease Mother    Colon cancer Father 13   Cancer Father    Colon polyps Brother    BRCA 1/2 Cousin    BRCA 1/2 Cousin    Stomach cancer Niece    Rectal cancer Niece    Esophageal cancer Niece    Colon cancer Niece 83    Social History    Socioeconomic History   Marital status: Married    Spouse name: Not on file   Number of children: Not on file   Years of education: Not on file   Highest education level: Not on file  Occupational History   Not on file  Tobacco Use   Smoking status: Never   Smokeless tobacco: Never  Vaping Use   Vaping Use: Never used  Substance and Sexual Activity   Alcohol use: Never   Drug use: Never   Sexual activity: Yes  Other Topics Concern   Not on file  Social History Narrative   Not on file   Social Determinants of Health   Financial Resource Strain: Not on file  Food Insecurity: Not on file  Transportation Needs: Not on file  Physical Activity: Not on file  Stress: Not on file  Social Connections: Not on file  Intimate Partner Violence: Not on file     Physical Exam: BP (!) 142/81   Pulse 79   Temp 97.8 F (36.6 C)   Ht '5\' 4"'  (1.626 m)   Wt 180 lb (81.6 kg)   SpO2 100%   BMI 30.90 kg/m  Constitutional: generally well-appearing Psychiatric: alert and oriented x3 Lungs: CTA bilaterally Heart: no MCR  Assessment and plan: 65 y.o. female with  fh crc, personal history of colon polyp  Colonoscopy today  Care is appropriate for the ambulatory setting.  Owens Loffler, MD [facility] 03/19/2021, 8:46 AM

## 2021-03-19 NOTE — Progress Notes (Signed)
Pt. Reports no change in her medical or surgical history since her pre-visit 03/08/2021.

## 2021-03-19 NOTE — Progress Notes (Signed)
To PACU, VSS. Report to [email or web address] 

## 2021-03-19 NOTE — Progress Notes (Signed)
Called to room to assist during endoscopic procedure.  Patient ID and intended procedure confirmed with present staff. Received instructions for my participation in the procedure from the performing physician.  

## 2021-03-19 NOTE — Patient Instructions (Signed)
Information on polyps given to you today.  Await pathology results.  Resume previous diet and medications.  YOU HAD AN ENDOSCOPIC PROCEDURE TODAY AT THE [facility]:   Refer to the procedure report that was given to you for any specific questions about what was found during the examination.  If the procedure report does not answer your questions, please call your gastroenterologist to clarify.  If you requested that your care partner not be given the details of your procedure findings, then the procedure report has been included in a sealed envelope for you to review at your convenience later.  YOU SHOULD EXPECT: Some feelings of bloating in the abdomen. Passage of more gas than usual.  Walking can help get rid of the air that was put into your GI tract during the procedure and reduce the bloating. If you had a lower endoscopy (such as a colonoscopy or flexible sigmoidoscopy) you may notice spotting of blood in your stool or on the toilet paper. If you underwent a bowel prep for your procedure, you may not have a normal bowel movement for a few days.  Please Note:  You might notice some irritation and congestion in your nose or some drainage.  This is from the oxygen used during your procedure.  There is no need for concern and it should clear up in a day or so.  SYMPTOMS TO REPORT IMMEDIATELY:   Following lower endoscopy (colonoscopy or flexible sigmoidoscopy):  Excessive amounts of blood in the stool  Significant tenderness or worsening of abdominal pains  Swelling of the abdomen that is new, acute  Fever of 100F or higher   For urgent or emergent issues, a gastroenterologist can be reached at any hour by calling [phone]. Do not use MyChart messaging for urgent concerns.    DIET:  We do recommend a small meal at first, but then you may proceed to your regular diet.  Drink plenty of fluids but you should avoid alcoholic beverages for 24 hours.  ACTIVITY:  You should  plan to take it easy for the rest of today and you should NOT DRIVE or use heavy machinery until tomorrow (because of the sedation medicines used during the test).    FOLLOW UP: Our staff will call the number listed on your records 48-72 hours following your procedure to check on you and address any questions or concerns that you may have regarding the information given to you following your procedure. If we do not reach you, we will leave a message.  We will attempt to reach you two times.  During this call, we will ask if you have developed any symptoms of COVID 19. If you develop any symptoms (ie: fever, flu-like symptoms, shortness of breath, cough etc.) before then, please call [phone].  If you test positive for Covid 19 in the 2 weeks post procedure, please call and report this information to us.    If any biopsies were taken you will be contacted by phone or by letter within the next 1-3 weeks.  Please call us at [phone] if you have not heard about the biopsies in 3 weeks.    SIGNATURES/CONFIDENTIALITY: You and/or your care partner have signed paperwork which will be entered into your electronic medical record.  These signatures attest to the fact that that the information above on your After Visit Summary has been reviewed and is understood.  Full responsibility of the confidentiality of this discharge information lies with you and/or your care-partner. 

## 2021-03-23 ENCOUNTER — Telehealth: Payer: Self-pay

## 2021-03-23 ENCOUNTER — Encounter: Payer: Self-pay | Admitting: Gastroenterology

## 2021-03-23 NOTE — Telephone Encounter (Signed)
Attempted follow up call. No answer, left VM.

## 2021-03-23 NOTE — Telephone Encounter (Signed)
Second post procedure follow up call, no answer 

## 2021-04-21 ENCOUNTER — Telehealth: Payer: Self-pay | Admitting: Internal Medicine

## 2021-04-21 NOTE — Telephone Encounter (Signed)
Pt is calling to est with dr Jerilee Hoh as new patient she is schedule for TOC with samantha worsley.  The pt cousin Wendy Payne is a patient of dr Jerilee Hoh and per Verdis Frederickson dr Jerilee Hoh agreed to see her cousin. Please confirm

## 2021-07-19 ENCOUNTER — Other Ambulatory Visit: Payer: Self-pay | Admitting: Podiatry

## 2021-07-19 ENCOUNTER — Encounter: Payer: Self-pay | Admitting: Podiatry

## 2021-07-19 ENCOUNTER — Ambulatory Visit: Payer: BC Managed Care – PPO | Admitting: Podiatry

## 2021-07-19 ENCOUNTER — Ambulatory Visit (INDEPENDENT_AMBULATORY_CARE_PROVIDER_SITE_OTHER): Payer: BC Managed Care – PPO

## 2021-07-19 ENCOUNTER — Other Ambulatory Visit: Payer: Self-pay

## 2021-07-19 DIAGNOSIS — G5761 Lesion of plantar nerve, right lower limb: Secondary | ICD-10-CM

## 2021-07-19 DIAGNOSIS — M79671 Pain in right foot: Secondary | ICD-10-CM

## 2021-07-19 MED ORDER — DEXAMETHASONE SODIUM PHOSPHATE 120 MG/30ML IJ SOLN
4.0000 mg | Freq: Once | INTRAMUSCULAR | Status: AC
  Administered 2021-07-19: 4 mg via INTRA_ARTICULAR

## 2021-07-19 MED ORDER — MELOXICAM 15 MG PO TABS: 15.0000 mg | ORAL_TABLET | Freq: Every day | ORAL | 0 refills | Status: DC

## 2021-07-19 NOTE — Progress Notes (Signed)
°  Subjective:  Patient ID: Wendy Payne, female    DOB: 17-Jan-1956,   MRN: [id number]  Chief Complaint  Patient presents with   Foot Pain    Pt is here due to pain on submet and 3rd and 4th toe.    66 y.o. female presents for concern of pain in her third and fourth toes and in the ball of her foot. This has been going on for about 4 months. Relates burning in between to toes and sharp shooting pains. Relates she walks four miles everyday and is very active. Has tried a toe spacer which did not help.  . Denies any other pedal complaints. Denies n/v/f/c.   Past Medical History:  Diagnosis Date   Blood transfusion without reported diagnosis    Colon polyps    Hypothyroidism    Uterine cancer [facility])    age 71    Objective:  Physical Exam: Vascular: DP/PT pulses 2/4 bilateral. CFT <3 seconds. Normal hair growth on digits. No edema.  Skin. No lacerations or abrasions bilateral feet.  Musculoskeletal: MMT 5/5 bilateral lower extremities in DF, PF, Inversion and Eversion. Deceased ROM in DF of ankle joint. Tender to third interspace upon palpation. Pain with medial squeeze and positive mulders click. Some pain with ROM of the third digit on the right.  Neurological: Sensation intact to light touch.   Assessment:   1. Morton's neuroma, right      Plan:  Patient was evaluated and treated and all questions answered. Discussed neuroma and treatment options with patient.  Radiographs reviewed and discussed with patient. No acte fractures or dislocations.  Injection offered today. Patient in agreement. Procedure below.  Discussed padding and offloading today.  Prescription for meloxicam provided.  Discussed if pain does not improve may consider  MRI for further surgical planning.  Patient to return in 6 weeks or sooner if concerns arise.     Lorenda Peck, DPM    Procedure: Injection Tendon/Ligament Discussed alternatives, risks, complications and verbal consent was obtained.   Location: Right third interspace. Skin Prep: Alcohol. Injectate: 1cc 0.5% marcaine plain, 1 cc dexamethasone.  Disposition: Patient tolerated procedure well. Injection site dressed with a band-aid.  Post-injection care was discussed and return precautions discussed.

## 2021-08-04 ENCOUNTER — Other Ambulatory Visit: Payer: Self-pay | Admitting: Internal Medicine

## 2021-08-04 DIAGNOSIS — Z1231 Encounter for screening mammogram for malignant neoplasm of breast: Secondary | ICD-10-CM

## 2021-08-07 ENCOUNTER — Other Ambulatory Visit: Payer: Self-pay | Admitting: Physician Assistant

## 2021-08-10 DIAGNOSIS — Z1231 Encounter for screening mammogram for malignant neoplasm of breast: Secondary | ICD-10-CM

## 2021-08-16 ENCOUNTER — Other Ambulatory Visit: Payer: Self-pay | Admitting: Podiatry

## 2021-08-17 ENCOUNTER — Encounter: Payer: BC Managed Care – PPO | Admitting: Physician Assistant

## 2021-08-18 ENCOUNTER — Encounter: Payer: Self-pay | Admitting: Internal Medicine

## 2021-08-18 ENCOUNTER — Ambulatory Visit: Payer: BC Managed Care – PPO | Admitting: Internal Medicine

## 2021-08-18 VITALS — BP 120/80 | HR 64 | Temp 98.0°F | Wt 184.8 lb

## 2021-08-18 DIAGNOSIS — Z800 Family history of malignant neoplasm of digestive organs: Secondary | ICD-10-CM

## 2021-08-18 DIAGNOSIS — E038 Other specified hypothyroidism: Secondary | ICD-10-CM

## 2021-08-18 DIAGNOSIS — Z8542 Personal history of malignant neoplasm of other parts of uterus: Secondary | ICD-10-CM

## 2021-08-18 DIAGNOSIS — E782 Mixed hyperlipidemia: Secondary | ICD-10-CM

## 2021-08-18 LAB — TSH: TSH: 2.98 u[IU]/mL (ref 0.35–5.50)

## 2021-08-18 MED ORDER — LEVOTHYROXINE SODIUM 112 MCG PO TABS: 112.0000 ug | ORAL_TABLET | Freq: Every day | ORAL | 1 refills | Status: DC

## 2021-08-18 NOTE — Progress Notes (Signed)
Established Patient Office Visit     This visit occurred during the SARS-CoV-2 public health emergency.  Safety protocols were in place, including screening questions prior to the visit, additional usage of staff PPE, and extensive cleaning of exam room while observing appropriate contact time as indicated for disinfecting solutions.    CC/Reason for Visit: Establish care, discuss chronic medical conditions, medication refills  HPI: Wendy Payne is a 66 y.o. female who is coming in today for the above mentioned reasons. Past Medical History is significant for: Hypothyroidism, vitamin D deficiency and she is currently dealing with a podiatrist for Morton's neuroma.  She does not smoke, she does not drink, she has no known drug allergies.  Her past surgical history significant for hysterectomy at age 74 due to uterine cancer.  Her family history significant for a father who died of colon cancer at age 50, niece who is currently in the final stages of colon cancer diagnosed at age 21.  Her mother had diabetes and pancreatic cancer.  She is overdue for flu, COVID booster, shingles, Tdap vaccines which she declines today.  She is having mammogram tomorrow.  She had a colonoscopy in 2022 and is 3-year callback.  She no longer does Pap smears due to her history.   Past Medical/Surgical History: Past Medical History:  Diagnosis Date   Blood transfusion without reported diagnosis    Colon polyps    Hypothyroidism    Uterine cancer [facility])    age 71    Past Surgical History:  Procedure Laterality Date   COLONOSCOPY  12/25/2015   in [place], [place] polyp-adenoma   hysterectomy and oopherectomy  Bilateral 1983   age 60 years.     Social History:  reports that she has never smoked. She has never used smokeless tobacco. She reports that she does not drink alcohol and does not use drugs.  Allergies: No Known Allergies  Family History:  Family History  Problem Relation Age of Onset    Cancer Mother    Kidney disease Mother    Colon cancer Father 84   Cancer Father    Colon polyps Brother    BRCA 1/2 Cousin    BRCA 1/2 Cousin    Stomach cancer Niece    Rectal cancer Niece    Esophageal cancer Niece    Colon cancer Niece 36     Current Outpatient Medications:    ergocalciferol (VITAMIN D2) 1.25 MG (50000 UT) capsule, Take 50,000 Units by mouth once a week., Disp: , Rfl:    levothyroxine (SYNTHROID) 112 MCG tablet, Take 1 tablet (112 mcg total) by mouth daily before breakfast., Disp: 90 tablet, Rfl: 1  Current Facility-Administered Medications:    0.9 %  sodium chloride infusion, 500 mL, Intravenous, Once, Milus Banister, MD  Review of Systems:  Constitutional: Denies fever, chills, diaphoresis, appetite change and fatigue.  HEENT: Denies photophobia, eye pain, redness, hearing loss, ear pain, congestion, sore throat, rhinorrhea, sneezing, mouth sores, trouble swallowing, neck pain, neck stiffness and tinnitus.   Respiratory: Denies SOB, DOE, cough, chest tightness,  and wheezing.   Cardiovascular: Denies chest pain, palpitations and leg swelling.  Gastrointestinal: Denies nausea, vomiting, abdominal pain, diarrhea, constipation, blood in stool and abdominal distention.  Genitourinary: Denies dysuria, urgency, frequency, hematuria, flank pain and difficulty urinating.  Endocrine: Denies: hot or cold intolerance, sweats, changes in hair or nails, polyuria, polydipsia. Musculoskeletal: Denies myalgias, back pain, joint swelling, arthralgias and gait problem.  Skin: Denies pallor, rash  and wound.  Neurological: Denies dizziness, seizures, syncope, weakness, light-headedness, numbness and headaches.  Hematological: Denies adenopathy. Easy bruising, personal or family bleeding history  Psychiatric/Behavioral: Denies suicidal ideation, mood changes, confusion, nervousness, sleep disturbance and agitation    Physical Exam: Vitals:   08/18/21 0924  BP: 120/80   Pulse: 64  Temp: 98 F (36.7 C)  TempSrc: Oral  SpO2: 97%  Weight: 184 lb 12.8 oz (83.8 kg)    Body mass index is 31.72 kg/m.   Constitutional: NAD, calm, comfortable Eyes: PERRL, lids and conjunctivae normal ENMT: Mucous membranes are moist. Respiratory: clear to auscultation bilaterally, no wheezing, no crackles. Normal respiratory effort. No accessory muscle use.  Cardiovascular: Regular rate and rhythm, no murmurs / rubs / gallops. No extremity edema.  Neurologic: Grossly intact and nonfocal Psychiatric: Normal judgment and insight. Alert and oriented x 3. Normal mood.    Impression and Plan:  Family history of colon cancer in father -Her last colonoscopy was in 02/2021 and she is on a 3-year callback schedule.  History of uterine cancer -Status post total hysterectomy.  Mixed hyperlipidemia -She will come back fasting for labs.  Other specified hypothyroidism  - Plan: TSH, TSH, levothyroxine (SYNTHROID) 112 MCG tablet  Time spent: 45 minutes reviewing chart, interviewing and examining patient, ordering labs, formulating plan of care and providing medication refills.     Lelon Frohlich, MD [facility]

## 2021-08-19 DIAGNOSIS — Z1231 Encounter for screening mammogram for malignant neoplasm of breast: Secondary | ICD-10-CM

## 2021-08-20 ENCOUNTER — Ambulatory Visit: Payer: BC Managed Care – PPO

## 2021-08-23 ENCOUNTER — Encounter: Payer: BC Managed Care – PPO | Admitting: Family Medicine

## 2021-08-30 ENCOUNTER — Ambulatory Visit: Payer: BC Managed Care – PPO | Admitting: Podiatry

## 2021-09-15 ENCOUNTER — Encounter: Payer: BC Managed Care – PPO | Admitting: Internal Medicine

## 2021-10-05 ENCOUNTER — Other Ambulatory Visit: Payer: Self-pay | Admitting: Internal Medicine

## 2021-10-05 DIAGNOSIS — Z1231 Encounter for screening mammogram for malignant neoplasm of breast: Secondary | ICD-10-CM

## 2021-10-07 ENCOUNTER — Ambulatory Visit
Admission: RE | Admit: 2021-10-07 | Discharge: 2021-10-07 | Disposition: A | Discharge status: Home / Self Care | Payer: BC Managed Care – PPO | Source: Ambulatory Visit

## 2021-10-07 DIAGNOSIS — Z1231 Encounter for screening mammogram for malignant neoplasm of breast: Secondary | ICD-10-CM

## 2021-10-11 ENCOUNTER — Ambulatory Visit (INDEPENDENT_AMBULATORY_CARE_PROVIDER_SITE_OTHER): Payer: BC Managed Care – PPO | Admitting: Internal Medicine

## 2021-10-11 ENCOUNTER — Other Ambulatory Visit: Payer: Self-pay | Admitting: *Deleted

## 2021-10-11 ENCOUNTER — Encounter: Payer: Self-pay | Admitting: Internal Medicine

## 2021-10-11 VITALS — BP 110/70 | HR 63 | Temp 97.6°F | Ht 64.5 in | Wt 184.3 lb

## 2021-10-11 DIAGNOSIS — E782 Mixed hyperlipidemia: Secondary | ICD-10-CM | POA: Diagnosis not present

## 2021-10-11 DIAGNOSIS — E038 Other specified hypothyroidism: Secondary | ICD-10-CM

## 2021-10-11 DIAGNOSIS — E669 Obesity, unspecified: Secondary | ICD-10-CM

## 2021-10-11 DIAGNOSIS — Z8542 Personal history of malignant neoplasm of other parts of uterus: Secondary | ICD-10-CM | POA: Diagnosis not present

## 2021-10-11 DIAGNOSIS — Z23 Encounter for immunization: Secondary | ICD-10-CM

## 2021-10-11 DIAGNOSIS — Z800 Family history of malignant neoplasm of digestive organs: Secondary | ICD-10-CM | POA: Diagnosis not present

## 2021-10-11 DIAGNOSIS — E66811 Obesity, class 1: Secondary | ICD-10-CM

## 2021-10-11 DIAGNOSIS — Z0000 Encounter for general adult medical examination without abnormal findings: Secondary | ICD-10-CM | POA: Diagnosis not present

## 2021-10-11 LAB — CBC WITH DIFFERENTIAL/PLATELET
Basophils Absolute: 0.1 10*3/uL (ref 0.0–0.1)
Basophils Relative: 0.7 % (ref 0.0–3.0)
Eosinophils Absolute: 0.1 10*3/uL (ref 0.0–0.7)
Eosinophils Relative: 1.8 % (ref 0.0–5.0)
HCT: 41.8 % (ref 36.0–46.0)
Hemoglobin: 14.2 g/dL (ref 12.0–15.0)
Lymphocytes Relative: 28.3 % (ref 12.0–46.0)
Lymphs Abs: 2.2 10*3/uL (ref 0.7–4.0)
MCHC: 34 g/dL (ref 30.0–36.0)
MCV: 87.9 fl (ref 78.0–100.0)
Monocytes Absolute: 0.6 10*3/uL (ref 0.1–1.0)
Monocytes Relative: 8.1 % (ref 3.0–12.0)
Neutro Abs: 4.7 10*3/uL (ref 1.4–7.7)
Neutrophils Relative %: 61.1 % (ref 43.0–77.0)
Platelets: 280 10*3/uL (ref 150.0–400.0)
RBC: 4.75 Mil/uL (ref 3.87–5.11)
RDW: 13.5 % (ref 11.5–15.5)
WBC: 7.7 10*3/uL (ref 4.0–10.5)

## 2021-10-11 LAB — LIPID PANEL
Cholesterol: 212 mg/dL — ABNORMAL HIGH (ref 0–200)
HDL: 70.2 mg/dL (ref >39.00)
LDL Cholesterol: 124 mg/dL — ABNORMAL HIGH (ref 0–99)
NonHDL: 141.6
Total CHOL/HDL Ratio: 3
Triglycerides: 89 mg/dL (ref 0.0–149.0)
VLDL: 17.8 mg/dL (ref 0.0–40.0)

## 2021-10-11 LAB — COMPREHENSIVE METABOLIC PANEL
ALT: 21 U/L (ref 0–35)
AST: 23 U/L (ref 0–37)
Albumin: 4.4 g/dL (ref 3.5–5.2)
Alkaline Phosphatase: 95 U/L (ref 39–117)
BUN: 18 mg/dL (ref 6–23)
CO2: 28 mEq/L (ref 19–32)
Calcium: 9.1 mg/dL (ref 8.4–10.5)
Chloride: 105 mEq/L (ref 96–112)
Creatinine, Ser: 0.87 mg/dL (ref 0.40–1.20)
GFR: 70.79 mL/min (ref >60.00)
Glucose, Bld: 99 mg/dL (ref 70–99)
Potassium: 4.6 mEq/L (ref 3.5–5.1)
Sodium: 139 mEq/L (ref 135–145)
Total Bilirubin: 0.6 mg/dL (ref 0.2–1.2)
Total Protein: 7.1 g/dL (ref 6.0–8.3)

## 2021-10-11 LAB — HEMOGLOBIN A1C: Hgb A1c MFr Bld: 5.9 % (ref 4.6–6.5)

## 2021-10-11 LAB — VITAMIN D 25 HYDROXY (VIT D DEFICIENCY, FRACTURES): VITD: 43.42 ng/mL (ref 30.00–100.00)

## 2021-10-11 LAB — VITAMIN B12: Vitamin B-12: 851 pg/mL (ref 211–911)

## 2021-10-11 MED ORDER — PHENTERMINE HCL 15 MG PO CAPS: 15.0000 mg | ORAL_CAPSULE | ORAL | 0 refills | Status: DC

## 2021-10-11 MED ORDER — LEVOTHYROXINE SODIUM 112 MCG PO TABS: 112.0000 ug | ORAL_TABLET | Freq: Every day | ORAL | 1 refills | Status: DC

## 2021-10-11 NOTE — Addendum Note (Signed)
Addended by: Westley Hummer B on: 10/11/2021 09:22 AM ? ? Modules accepted: Orders ? ?

## 2021-10-11 NOTE — Telephone Encounter (Signed)
Last refill 01/09/22 ?Last office visit 08/18/21 ?Okay to fill? ?

## 2021-10-11 NOTE — Patient Instructions (Addendum)
-  Nice seeing you today!! ? ?-Lab work today; will notify you once results are available. ? ?-Tdap vaccine today. Remember your shingles vaccines and COVID booster. ? ?-Start phentermine 15 mg per day for 3 months. ? ?-Schedule follow up in 6 months. ? ? ? ?

## 2021-10-11 NOTE — Progress Notes (Signed)
? ? ? ?Established Patient Office Visit ? ? ? ? ?This visit occurred during the SARS-CoV-2 public health emergency.  Safety protocols were in place, including screening questions prior to the visit, additional usage of staff PPE, and extensive cleaning of exam room while observing appropriate contact time as indicated for disinfecting solutions.  ? ? ?CC/Reason for Visit: Annual preventive exam, discuss obesity ? ?HPI: Wendy Payne is a 66 y.o. female who is coming in today for the above mentioned reasons. Past Medical History is significant for: Hypothyroidism and vitamin D deficiency.  Recent thyroid function test were normal and she has been continued on 112 mcg of levothyroxine.  She has routine eye and dental care.  She is overdue for COVID booster, flu vaccine, Tdap and shingles.  She had a mammogram earlier this month and a colonoscopy in 2022, 3-year callback due to family history.  She has had a complete hysterectomy due to her history of uterine cancer and was told no further Pap smears were needed.  She has been on phentermine in the past for weight loss and is interested in pursuing this again. ? ? ?Past Medical/Surgical History: ?Past Medical History:  ?Diagnosis Date  ? Blood transfusion without reported diagnosis   ? Colon polyps   ? Hypothyroidism   ? Uterine cancer ([place])   ? age 73  ? ? ?Past Surgical History:  ?Procedure Laterality Date  ? COLONOSCOPY  12/25/2015  ? in [place], [place] polyp-adenoma  ? hysterectomy and oopherectomy  Bilateral 1983  ? age 5 years.   ? ? ?Social History: ? reports that she has never smoked. She has never used smokeless tobacco. She reports that she does not drink alcohol and does not use drugs. ? ?Allergies: ?No Known Allergies ? ?Family History:  ?Family History  ?Problem Relation Age of Onset  ? Cancer Mother   ? Kidney disease Mother   ? Colon cancer Father 55  ? Cancer Father   ? Breast cancer Maternal Aunt   ? Breast cancer Paternal Aunt   ? BRCA 1/2 Cousin    ? BRCA 1/2 Cousin   ? Colon polyps Brother   ? Stomach cancer Niece   ? Rectal cancer Niece   ? Esophageal cancer Niece   ? Colon cancer Niece 28  ? ? ? ?Current Outpatient Medications:  ?  Cholecalciferol (VITAMIN D) 50 MCG (2000 UT) tablet, Take 2,000 Units by mouth daily., Disp: , Rfl:  ?  phentermine 15 MG capsule, Take 1 capsule (15 mg total) by mouth every morning., Disp: 90 capsule, Rfl: 0 ?  levothyroxine (SYNTHROID) 112 MCG tablet, Take 1 tablet (112 mcg total) by mouth daily before breakfast., Disp: 90 tablet, Rfl: 1 ? ?Current Facility-Administered Medications:  ?  0.9 %  sodium chloride infusion, 500 mL, Intravenous, Once, Milus Banister, MD ? ?Review of Systems:  ?Constitutional: Denies fever, chills, diaphoresis, appetite change and fatigue.  ?HEENT: Denies photophobia, eye pain, redness, hearing loss, ear pain, congestion, sore throat, rhinorrhea, sneezing, mouth sores, trouble swallowing, neck pain, neck stiffness and tinnitus.   ?Respiratory: Denies SOB, DOE, cough, chest tightness,  and wheezing.   ?Cardiovascular: Denies chest pain, palpitations and leg swelling.  ?Gastrointestinal: Denies nausea, vomiting, abdominal pain, diarrhea, constipation, blood in stool and abdominal distention.  ?Genitourinary: Denies dysuria, urgency, frequency, hematuria, flank pain and difficulty urinating.  ?Endocrine: Denies: hot or cold intolerance, sweats, changes in hair or nails, polyuria, polydipsia. ?Musculoskeletal: Denies myalgias, back pain, joint swelling, arthralgias and  gait problem.  ?Skin: Denies pallor, rash and wound.  ?Neurological: Denies dizziness, seizures, syncope, weakness, light-headedness, numbness and headaches.  ?Hematological: Denies adenopathy. Easy bruising, personal or family bleeding history  ?Psychiatric/Behavioral: Denies suicidal ideation, mood changes, confusion, nervousness, sleep disturbance and agitation ? ? ? ?Physical Exam: ?Vitals:  ? 10/11/21 0826  ?BP: 110/70  ?Pulse: 63   ?Temp: 97.6 ?F (36.4 ?C)  ?TempSrc: Oral  ?SpO2: 98%  ?Weight: 184 lb 4.8 oz (83.6 kg)  ?Height: 5' 4.5" (1.638 m)  ? ? ?Body mass index is 31.15 kg/m?. ? ? ?Constitutional: NAD, calm, comfortable ?Eyes: PERRL, lids and conjunctivae normal ?ENMT: Mucous membranes are moist. Posterior pharynx clear of any exudate or lesions. Normal dentition. Tympanic membrane is pearly white, no erythema or bulging. ?Neck: normal, supple, no masses, no thyromegaly ?Respiratory: clear to auscultation bilaterally, no wheezing, no crackles. Normal respiratory effort. No accessory muscle use.  ?Cardiovascular: Regular rate and rhythm, no murmurs / rubs / gallops. No extremity edema. 2+ pedal pulses. No carotid bruits.  ?Abdomen: no tenderness, no masses palpated. No hepatosplenomegaly. Bowel sounds positive.  ?Musculoskeletal: no clubbing / cyanosis. No joint deformity upper and lower extremities. Good ROM, no contractures. Normal muscle tone.  ?Skin: no rashes, lesions, ulcers. No induration ?Neurologic: CN 2-12 grossly intact. Sensation intact, DTR normal. Strength 5/5 in all 4.  ?Psychiatric: Normal judgment and insight. Alert and oriented x 3. Normal mood.  ? ? ?Impression and Plan: ? ?Encounter for preventive health examination ?-Recommend routine eye and dental care. ?-Immunizations: We have discussed updating Tdap, shingles, COVID booster.  She is agreeable to Tdap, declines the rest. ?-Healthy lifestyle discussed in detail. ?-Labs to be updated today. ?-Colon cancer screening: 02/2021, 3-year callback ?-Breast cancer screening: 09/2021 ?-Cervical cancer screening: Not applicable due to history of uterine cancer status post complete hysterectomy ?-Lung cancer screening: Not applicable, never smoker ?-Prostate cancer screening: Not applicable ?-DEXA: Not applicable ? ?Other specified hypothyroidism  ?- Plan: levothyroxine (SYNTHROID) 112 MCG tablet ? ?History of uterine cancer ?-Status post complete hysterectomy at age 27 ? ?Family  history of colon cancer in father/niece ?-She had a colonoscopy in September 2022 and is a 3-year callback. ? ?Mixed hyperlipidemia ? - Plan: Lipid panel ? ?Obesity (BMI 30.0-34.9) ? - Plan: CBC with Differential/Platelet, Comprehensive metabolic panel, Hemoglobin A1c, Vitamin B12, VITAMIN D 25 Hydroxy (Vit-D Deficiency, Fractures), phentermine 15 MG capsule ?-Discussed healthy lifestyle, including increased physical activity and better food choices to promote weight loss. ?-We have discussed restarting phentermine for 12 weeks with a rest period afterwards.  We have discussed importance of lifestyle changes. ? ?Need for Tdap vaccination ?-Tdap administered today ? ? ? ? ?Patient Instructions  ?-Nice seeing you today!! ? ?-Lab work today; will notify you once results are available. ? ?-Tdap vaccine today. Remember your shingles vaccines and COVID booster. ? ?-Start phentermine 15 mg per day for 3 months. ? ?-Schedule follow up in 6 months. ? ? ? ? ? ? ?Lelon Frohlich, MD ?[facility] ? ? ?

## 2021-10-13 ENCOUNTER — Encounter: Payer: Self-pay | Admitting: Internal Medicine

## 2021-10-13 ENCOUNTER — Other Ambulatory Visit: Payer: Self-pay | Admitting: Internal Medicine

## 2021-10-13 DIAGNOSIS — E782 Mixed hyperlipidemia: Secondary | ICD-10-CM

## 2021-10-13 DIAGNOSIS — E669 Obesity, unspecified: Secondary | ICD-10-CM

## 2021-10-13 DIAGNOSIS — R7303 Prediabetes: Secondary | ICD-10-CM

## 2021-10-13 MED ORDER — PHENTERMINE HCL 15 MG PO CAPS: 15.0000 mg | ORAL_CAPSULE | ORAL | 0 refills | Status: DC

## 2021-10-22 ENCOUNTER — Encounter: Payer: Self-pay | Admitting: Podiatry

## 2021-10-22 ENCOUNTER — Ambulatory Visit: Payer: BC Managed Care – PPO | Admitting: Podiatry

## 2021-10-22 DIAGNOSIS — M778 Other enthesopathies, not elsewhere classified: Secondary | ICD-10-CM | POA: Diagnosis not present

## 2021-10-22 DIAGNOSIS — G5761 Lesion of plantar nerve, right lower limb: Secondary | ICD-10-CM | POA: Diagnosis not present

## 2021-10-22 DIAGNOSIS — M7741 Metatarsalgia, right foot: Secondary | ICD-10-CM

## 2021-10-22 MED ORDER — METHYLPREDNISOLONE 4 MG PO TBPK
ORAL_TABLET | ORAL | 0 refills | Status: DC
Start: 2021-10-22 — End: 2021-12-20

## 2021-10-22 NOTE — Progress Notes (Signed)
?  Subjective:  ?Patient ID: Wendy Payne, female    DOB: [birth year],   MRN: [id number] ? ?Chief Complaint  ?Patient presents with  ? Foot Pain  ?  Right foot pain  ? ? ?66 y.o. female presents for follow-up of right metatarsalgia pain. Relates the injection did not help. Still getting the same amount of pain. The padding may help a little. Relates she walks four miles everyday and is very active. Has tried a toe spacer which did not help.  . Denies any other pedal complaints. Denies n/v/f/c.  ? ?Past Medical History:  ?Diagnosis Date  ? Blood transfusion without reported diagnosis   ? Colon polyps   ? Hypothyroidism   ? Uterine cancer ([place])   ? age 58  ? ? ?Objective:  ?Physical Exam: ?Vascular: DP/PT pulses 2/4 bilateral. CFT <3 seconds. Normal hair growth on digits. No edema.  ?Skin. No lacerations or abrasions bilateral feet.  ?Musculoskeletal: MMT 5/5 bilateral lower extremities in DF, PF, Inversion and Eversion. Deceased ROM in DF of ankle joint. Tender to third interspace upon palpation. Pain with medial squeeze and positive mulders click. Some pain with ROM of the third digit on the right. Most pain to plantarflexion of third digit today. No real pain to dorsal MPJ on the third.  ?Neurological: Sensation intact to light touch.  ? ?Assessment:  ? ?1. Morton's neuroma, right   ?2. Capsulitis of right foot   ? ? ? ?Plan:  ?Patient was evaluated and treated and all questions answered. ?Discussed neuroma vs capuslitis  and treatment options with patient.  ?Radiographs reviewed and discussed with patient. No acte fractures or dislocations.   ?Continue padding and meloxicam.  ?Medrol dose pack provided.   ?MRI ordered.  ?Patient to return tor review MRI  ? ? ?Lorenda Peck, DPM  ? ? ? ? ? ?

## 2021-11-04 ENCOUNTER — Other Ambulatory Visit: Payer: Self-pay | Admitting: *Deleted

## 2021-11-04 DIAGNOSIS — E669 Obesity, unspecified: Secondary | ICD-10-CM

## 2021-11-04 NOTE — Telephone Encounter (Signed)
Last refill 12/25/21 ?Last office visit 10/11/21 ?Okay to fill? ?

## 2021-11-10 ENCOUNTER — Other Ambulatory Visit: Payer: BC Managed Care – PPO

## 2021-12-03 ENCOUNTER — Telehealth: Payer: Self-pay | Admitting: Internal Medicine

## 2021-12-03 NOTE — Telephone Encounter (Signed)
Patient is requesting to transfer care from Dr. Jerilee Hoh to Dr. Cherlynn Kaiser due to incompatible patient/provider relationship. Please Advise.

## 2021-12-06 NOTE — Telephone Encounter (Signed)
Called patient on 12/06/21 to schedule a TOC with Dr. Cherlynn Kaiser but due to no answer I LVM.

## 2021-12-20 ENCOUNTER — Encounter: Payer: Self-pay | Admitting: Family Medicine

## 2021-12-20 ENCOUNTER — Ambulatory Visit: Payer: BC Managed Care – PPO | Admitting: Family Medicine

## 2021-12-20 VITALS — BP 126/75 | HR 57 | Temp 97.9°F | Ht 64.5 in | Wt 187.8 lb

## 2021-12-20 DIAGNOSIS — E038 Other specified hypothyroidism: Secondary | ICD-10-CM | POA: Diagnosis not present

## 2021-12-20 DIAGNOSIS — R7303 Prediabetes: Secondary | ICD-10-CM | POA: Diagnosis not present

## 2021-12-20 DIAGNOSIS — Z800 Family history of malignant neoplasm of digestive organs: Secondary | ICD-10-CM | POA: Diagnosis not present

## 2021-12-20 DIAGNOSIS — E669 Obesity, unspecified: Secondary | ICD-10-CM

## 2022-04-28 ENCOUNTER — Other Ambulatory Visit: Payer: Self-pay | Admitting: Internal Medicine

## 2022-04-28 DIAGNOSIS — E038 Other specified hypothyroidism: Secondary | ICD-10-CM

## 2022-05-14 IMAGING — MG MM DIGITAL SCREENING BILAT W/ TOMO AND CAD
6 of 10 series · 6 of 30 positions shown · non-contrast
Comparison: Previous exam(s).

CLINICAL DATA: Screening.

EXAM:
DIGITAL SCREENING BILATERAL MAMMOGRAM WITH TOMOSYNTHESIS AND CAD
TECHNIQUE: Bilateral screening digital craniocaudal and mediolateral oblique
mammograms were obtained. Bilateral screening digital breast
tomosynthesis was performed. The images were evaluated with
computer-aided detection.

[L CC synth-2D]
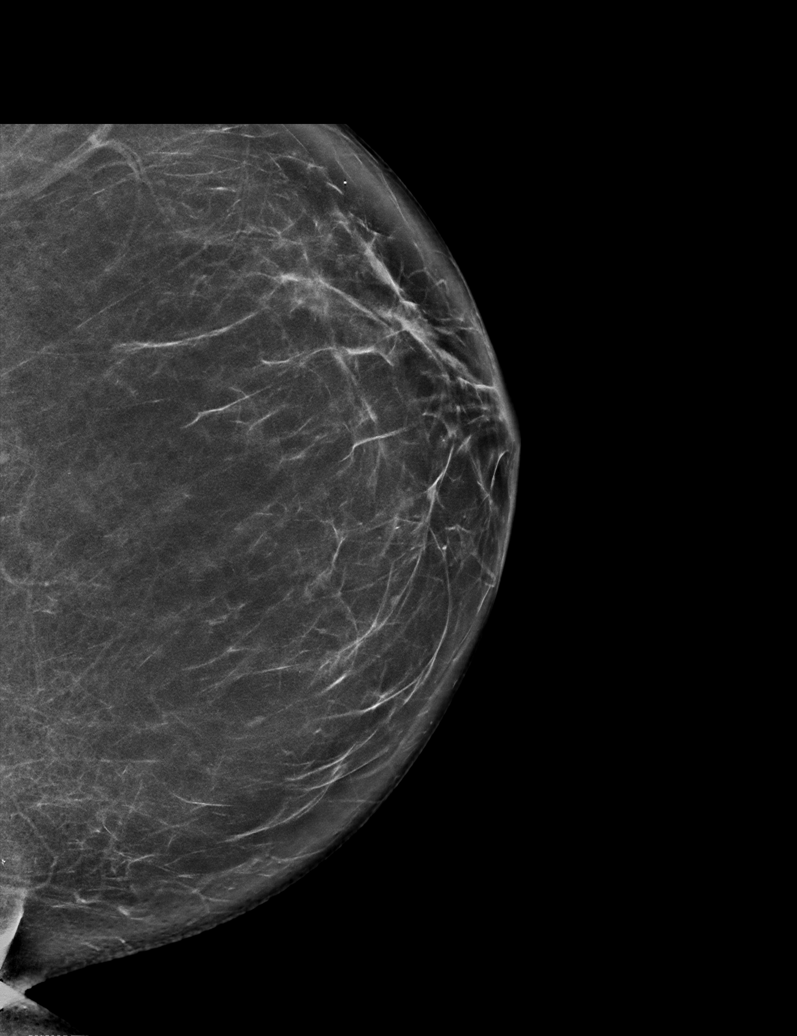

[R MLO synth-2D (1 of 2)]
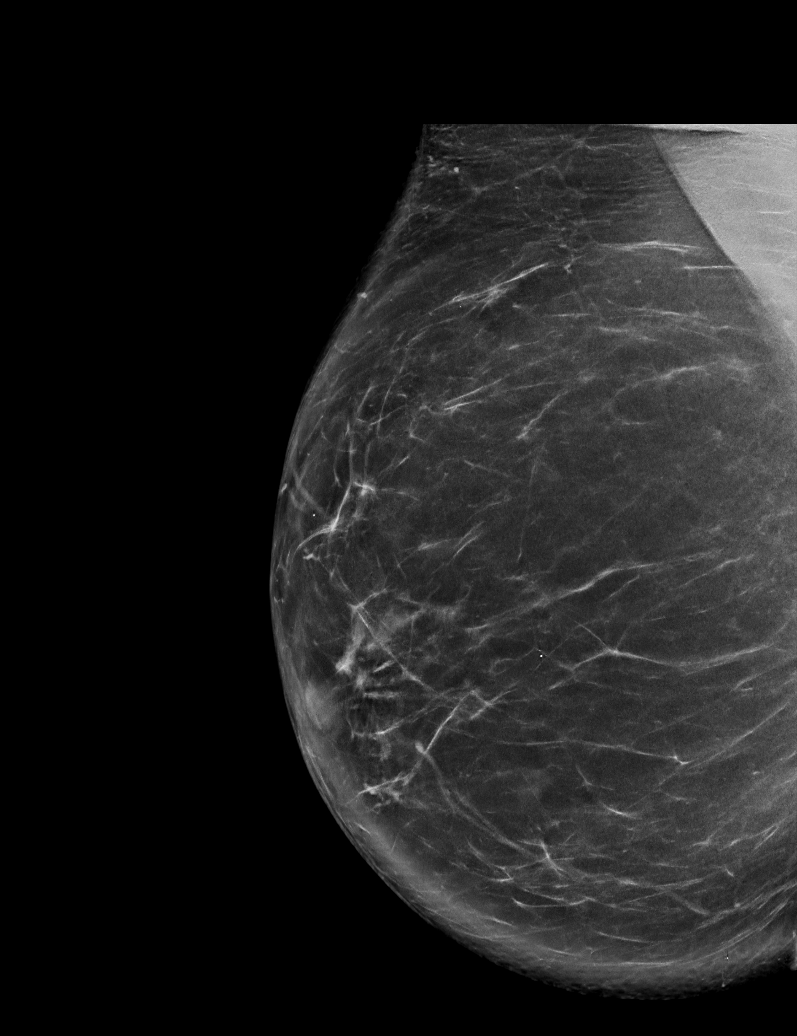

[R MLO synth-2D (2 of 2)]
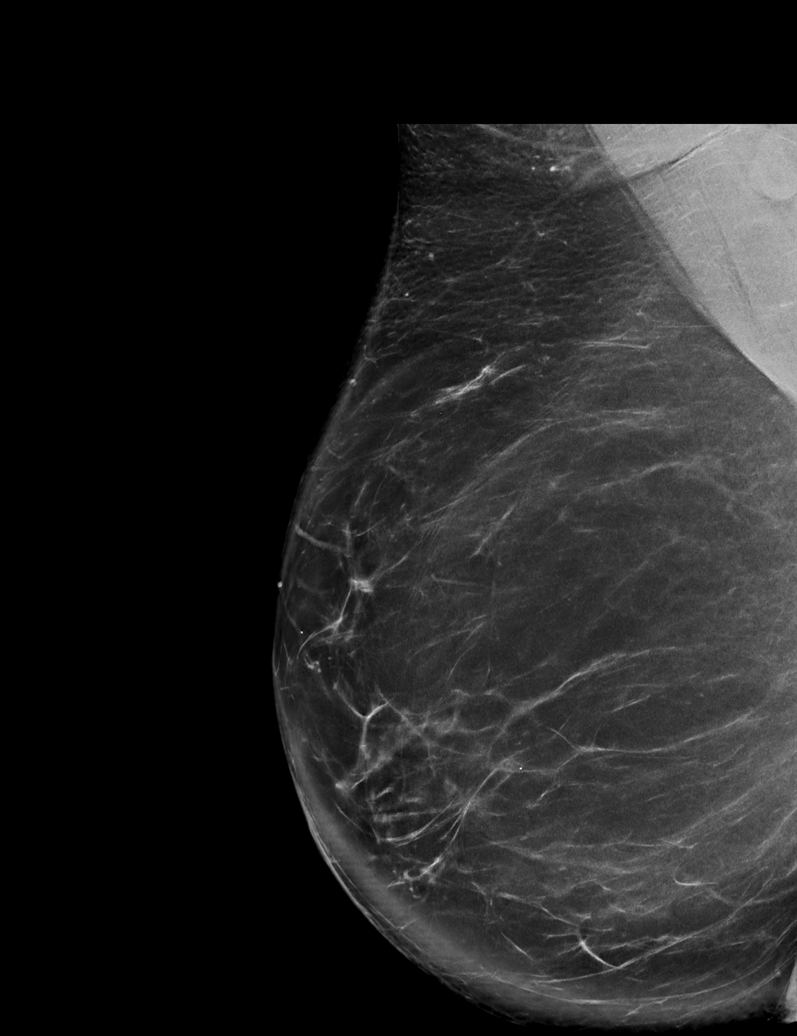

[L MLO synth-2D]
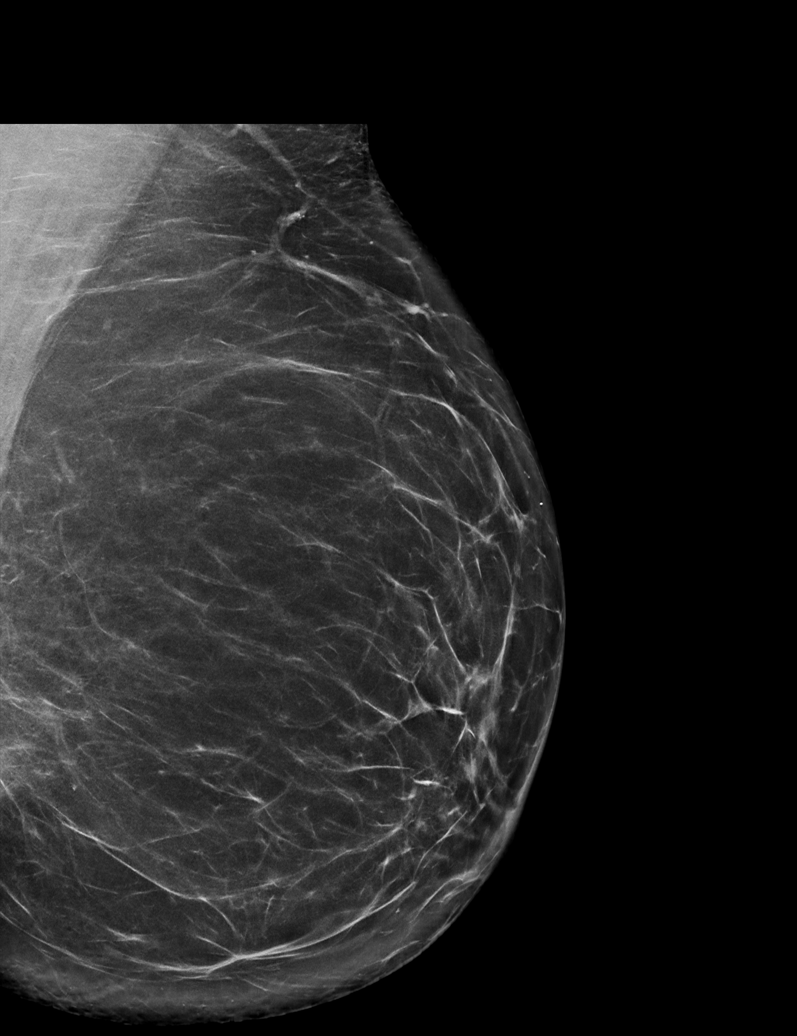

[R CC synth-2D]
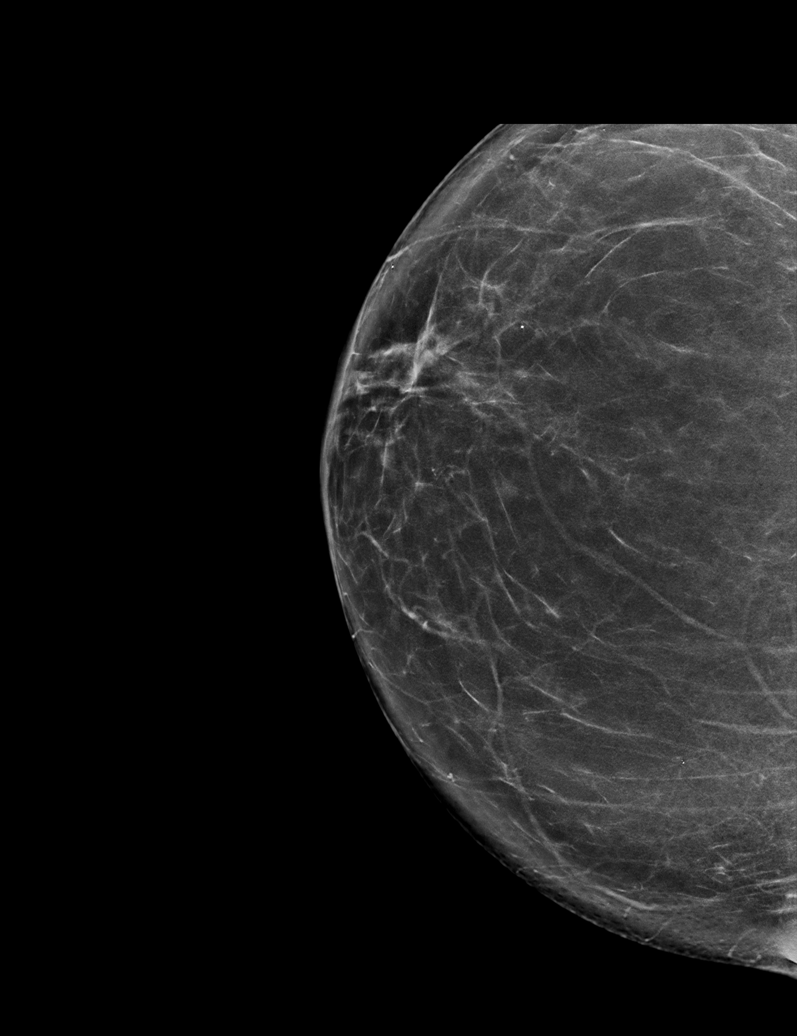

[L CC tomo · tomo slice 39/76.0]
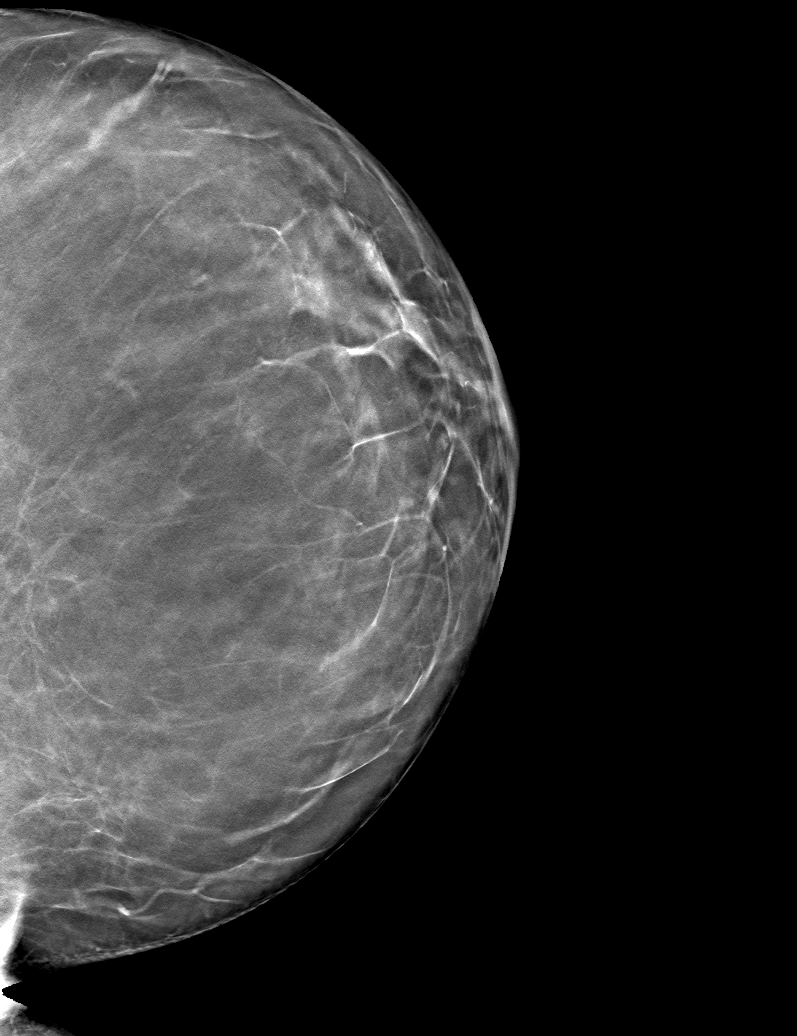

[6 of 30 positions shown; findings below may reference images not displayed]

ACR Breast Density Category b: There are scattered areas of
fibroglandular density.
FINDINGS: There are no findings suspicious for malignancy.
IMPRESSION: No mammographic evidence of malignancy. A result letter of this
screening mammogram will be mailed directly to the patient.

RECOMMENDATION:
Screening mammogram in one year. (Code:51-O-LD2)

BI-RADS CATEGORY  1: Negative.

## 2022-06-08 ENCOUNTER — Other Ambulatory Visit: Payer: Self-pay | Admitting: Internal Medicine

## 2022-06-08 DIAGNOSIS — E038 Other specified hypothyroidism: Secondary | ICD-10-CM

## 2022-06-09 ENCOUNTER — Encounter: Payer: Self-pay | Admitting: *Deleted

## 2022-06-15 ENCOUNTER — Ambulatory Visit: Payer: BC Managed Care – PPO | Admitting: Family Medicine

## 2022-06-15 ENCOUNTER — Encounter: Payer: Self-pay | Admitting: Family Medicine

## 2022-06-15 VITALS — BP 130/66 | HR 72 | Temp 98.7°F | Resp 16 | Ht 64.5 in | Wt 186.1 lb

## 2022-06-15 DIAGNOSIS — R7303 Prediabetes: Secondary | ICD-10-CM | POA: Diagnosis not present

## 2022-06-15 DIAGNOSIS — E038 Other specified hypothyroidism: Secondary | ICD-10-CM | POA: Diagnosis not present

## 2022-06-15 DIAGNOSIS — E6609 Other obesity due to excess calories: Secondary | ICD-10-CM

## 2022-06-15 DIAGNOSIS — Z6831 Body mass index (BMI) 31.0-31.9, adult: Secondary | ICD-10-CM

## 2022-06-15 MED ORDER — ZEPBOUND 2.5 MG/0.5ML ~~LOC~~ SOAJ: 2.5000 mg | SUBCUTANEOUS | 0 refills | Status: DC

## 2022-06-15 MED ORDER — METFORMIN HCL 500 MG PO TABS: 500.0000 mg | ORAL_TABLET | Freq: Two times a day (BID) | ORAL | 1 refills | Status: DC

## 2022-06-15 NOTE — Patient Instructions (Signed)
It was very nice to see you today!  Happy Holidays  For constipation.  Water, metamucil, colace '200mg'$ /day, miralax. Massage.   PLEASE NOTE:  If you had any lab tests please let us know if you have not heard back within a few days. You may see your results on MyChart before we have a chance to review them but we will give you a call once they are reviewed by [place]. If we ordered any referrals today, please let us know if you have not heard from their office within the next week.   Please try these tips to maintain a healthy lifestyle:  Eat most of your calories during the day when you are active. Eliminate processed foods including packaged sweets (pies, cakes, cookies), reduce intake of potatoes, white bread, white pasta, and white rice. Look for whole grain options, oat flour or almond flour.  Each meal should contain half fruits/vegetables, one quarter protein, and one quarter carbs (no bigger than a computer mouse).  Cut down on sweet beverages. This includes juice, soda, and sweet tea. Also watch fruit intake, though this is a healthier sweet option, it still contains natural sugar! Limit to 3 servings daily.  Drink at least 1 glass of water with each meal and aim for at least 8 glasses per day  Exercise at least 150 minutes every week.

## 2022-06-15 NOTE — Progress Notes (Signed)
Subjective:     Patient ID: Wendy Payne, female    DOB: Nov 01, 1955, 66 y.o.   MRN: [id number]  Chief Complaint  Patient presents with   Follow-up    General follow-up  Not fasting Discuss shingles vaccine    HPI  Hypothyroidism-on 112 synthroid preDM-FH DM Obesity-intol phentermine. Active working on diet. Brother on ozempic.  Husb takes metformin  Health Maintenance Due  Topic Date Due   Zoster Vaccines- Shingrix (1 of 2) Never done    Past Medical History:  Diagnosis Date   Blood transfusion without reported diagnosis    Colon polyps    Hypothyroidism    Uterine cancer [facility])    age 29    Past Surgical History:  Procedure Laterality Date   COLONOSCOPY  12/25/2015   in [place], [place] polyp-adenoma   hysterectomy and oopherectomy  Bilateral 1983   age 27 years.  endomet ca    Outpatient Medications Prior to Visit  Medication Sig Dispense Refill   Cholecalciferol (VITAMIN D) 50 MCG (2000 UT) tablet Take 2,000 Units by mouth daily.     levothyroxine (SYNTHROID) 112 MCG tablet TAKE 1 TABLET BY MOUTH DAILY BEFORE BREAKFAST. 90 tablet 1   Facility-Administered Medications Prior to Visit  Medication Dose Route Frequency Provider Last Rate Last Admin   0.9 %  sodium chloride infusion  500 mL Intravenous Once Milus Banister, MD        No Known Allergies ROS neg/noncontributory except as noted HPI/below      Objective:     BP 130/66   Pulse 72   Temp 98.7 F (37.1 C) (Temporal)   Resp 16   Ht 5' 4.5" (1.638 m)   Wt 186 lb 2 oz (84.4 kg)   SpO2 97%   BMI 31.45 kg/m  Wt Readings from Last 3 Encounters:  06/15/22 186 lb 2 oz (84.4 kg)  12/20/21 187 lb 12.8 oz (85.2 kg)  10/11/21 184 lb 4.8 oz (83.6 kg)    Physical Exam   Gen: WDWN NAD HEENT: NCAT, conjunctiva not injected, sclera nonicteric NECK:  supple, no thyromegaly, no nodes, no carotid bruits CARDIAC: RRR, S1S2+, no murmur. DP 2+B MSK: no gross abnormalities.  NEURO: A&O x3.  CN  II-XII intact.  PSYCH: normal mood. Good eye contact     Assessment & Plan:   Problem List Items Addressed This Visit       Endocrine   Hypothyroidism - Primary     Other   Prediabetes   Other Visit Diagnoses     Class 1 obesity due to excess calories without serious comorbidity with body mass index (BMI) of 31.0 to 31.9 in adult       Relevant Medications   metFORMIN (GLUCOPHAGE) 500 MG tablet   tirzepatide (ZEPBOUND) 2.5 MG/0.5ML Pen      Hypothyroidism-chronic.  Controlled. Cont synthroid 112-not needing refills.  Wants to hold on labs PreDM-pt working on diet/exercise mostly.  FH DM.  Discussed meds, rationale, etc  pt chooses to do metformin '500mg'$  bid-SED Obesity-BMI 31.  Discussed meds/shortages.  Will do zepbound.  Call monthly for increases.  SED.  Get co-pay card-she may pay cash.  Discussed how to manage constipation.  F/u 3-4 mo(annual in April).   Meds ordered this encounter  Medications   metFORMIN (GLUCOPHAGE) 500 MG tablet    Sig: Take 1 tablet (500 mg total) by mouth 2 (two) times daily with a meal.    Dispense:  180 tablet  Refill:  1   tirzepatide (ZEPBOUND) 2.5 MG/0.5ML Pen    Sig: Inject 2.5 mg into the skin once a week.    Dispense:  2 mL    Refill:  0    Wellington Hampshire, MD

## 2022-07-14 ENCOUNTER — Telehealth: Payer: Self-pay | Admitting: Family Medicine

## 2022-07-14 ENCOUNTER — Other Ambulatory Visit: Payer: Self-pay | Admitting: Family Medicine

## 2022-07-14 MED ORDER — ZEPBOUND 5 MG/0.5ML ~~LOC~~ SOAJ: 5.0000 mg | SUBCUTANEOUS | 0 refills | Status: DC

## 2022-07-14 MED ORDER — ZEPBOUND 2.5 MG/0.5ML ~~LOC~~ SOAJ: 2.5000 mg | SUBCUTANEOUS | 0 refills | Status: DC

## 2022-07-14 NOTE — Telephone Encounter (Signed)
Noted  

## 2022-07-14 NOTE — Telephone Encounter (Signed)
Patient states RX for Zepbound was supposed to have an increase to '5MG'$  for the second month, then third month be 7.5 MG.  Patient requests new RX for Zepbound 5 MG be sent to  Preferred pharmacy: [id number] [address] Patient's earlier MyChart message: "Patient Comment: I would like to continue taking Zepbound '5mg'$  for the second month. I had no side effects  the first month and felt well.  Thank you, IDawne  Patient states she took her last dose Zepbound 2.5 MG today (07/14/22)

## 2022-08-12 ENCOUNTER — Other Ambulatory Visit: Payer: Self-pay | Admitting: Family Medicine

## 2022-08-13 ENCOUNTER — Other Ambulatory Visit: Payer: Self-pay | Admitting: Family Medicine

## 2022-08-13 MED ORDER — ZEPBOUND 7.5 MG/0.5ML ~~LOC~~ SOAJ: 7.5000 mg | SUBCUTANEOUS | 0 refills | Status: DC

## 2022-08-14 ENCOUNTER — Other Ambulatory Visit: Payer: Self-pay | Admitting: Family Medicine

## 2022-09-29 ENCOUNTER — Other Ambulatory Visit: Payer: Self-pay | Admitting: Family Medicine

## 2022-09-29 MED ORDER — ZEPBOUND 7.5 MG/0.5ML ~~LOC~~ SOAJ: 7.5000 mg | SUBCUTANEOUS | 0 refills | Status: DC

## 2022-10-20 ENCOUNTER — Ambulatory Visit (INDEPENDENT_AMBULATORY_CARE_PROVIDER_SITE_OTHER): Payer: BC Managed Care – PPO | Admitting: Family Medicine

## 2022-10-20 ENCOUNTER — Encounter (HOSPITAL_BASED_OUTPATIENT_CLINIC_OR_DEPARTMENT_OTHER): Payer: Self-pay

## 2022-10-20 ENCOUNTER — Encounter: Payer: Self-pay | Admitting: Family Medicine

## 2022-10-20 ENCOUNTER — Other Ambulatory Visit (HOSPITAL_BASED_OUTPATIENT_CLINIC_OR_DEPARTMENT_OTHER): Payer: Self-pay

## 2022-10-20 VITALS — BP 112/60 | HR 64 | Temp 97.8°F | Resp 16 | Ht 64.5 in | Wt 164.2 lb

## 2022-10-20 DIAGNOSIS — E2839 Other primary ovarian failure: Secondary | ICD-10-CM | POA: Diagnosis not present

## 2022-10-20 DIAGNOSIS — Z1231 Encounter for screening mammogram for malignant neoplasm of breast: Secondary | ICD-10-CM

## 2022-10-20 DIAGNOSIS — Z0000 Encounter for general adult medical examination without abnormal findings: Secondary | ICD-10-CM

## 2022-10-20 DIAGNOSIS — Z6827 Body mass index (BMI) 27.0-27.9, adult: Secondary | ICD-10-CM

## 2022-10-20 DIAGNOSIS — R7303 Prediabetes: Secondary | ICD-10-CM | POA: Diagnosis not present

## 2022-10-20 DIAGNOSIS — E038 Other specified hypothyroidism: Secondary | ICD-10-CM

## 2022-10-20 DIAGNOSIS — E663 Overweight: Secondary | ICD-10-CM

## 2022-10-20 DIAGNOSIS — E039 Hypothyroidism, unspecified: Secondary | ICD-10-CM | POA: Diagnosis not present

## 2022-10-20 LAB — COMPREHENSIVE METABOLIC PANEL
ALT: 13 U/L (ref 0–35)
AST: 18 U/L (ref 0–37)
Albumin: 4.4 g/dL (ref 3.5–5.2)
Alkaline Phosphatase: 72 U/L (ref 39–117)
BUN: 17 mg/dL (ref 6–23)
CO2: 29 mEq/L (ref 19–32)
Calcium: 9.7 mg/dL (ref 8.4–10.5)
Chloride: 104 mEq/L (ref 96–112)
Creatinine, Ser: 0.9 mg/dL (ref 0.40–1.20)
GFR: 67.48 mL/min (ref >60.00)
Glucose, Bld: 89 mg/dL (ref 70–99)
Potassium: 4.1 mEq/L (ref 3.5–5.1)
Sodium: 140 mEq/L (ref 135–145)
Total Bilirubin: 0.9 mg/dL (ref 0.2–1.2)
Total Protein: 6.9 g/dL (ref 6.0–8.3)

## 2022-10-20 LAB — LIPID PANEL
Cholesterol: 209 mg/dL — ABNORMAL HIGH (ref 0–200)
HDL: 64.7 mg/dL (ref >39.00)
LDL Cholesterol: 123 mg/dL — ABNORMAL HIGH (ref 0–99)
NonHDL: 144.1
Total CHOL/HDL Ratio: 3
Triglycerides: 107 mg/dL (ref 0.0–149.0)
VLDL: 21.4 mg/dL (ref 0.0–40.0)

## 2022-10-20 LAB — CBC WITH DIFFERENTIAL/PLATELET
Basophils Absolute: 0 10*3/uL (ref 0.0–0.1)
Basophils Relative: 0.7 % (ref 0.0–3.0)
Eosinophils Absolute: 0.1 10*3/uL (ref 0.0–0.7)
Eosinophils Relative: 1.7 % (ref 0.0–5.0)
HCT: 43.6 % (ref 36.0–46.0)
Hemoglobin: 15 g/dL (ref 12.0–15.0)
Lymphocytes Relative: 30.8 % (ref 12.0–46.0)
Lymphs Abs: 2.2 10*3/uL (ref 0.7–4.0)
MCHC: 34.4 g/dL (ref 30.0–36.0)
MCV: 87.2 fl (ref 78.0–100.0)
Monocytes Absolute: 0.6 10*3/uL (ref 0.1–1.0)
Monocytes Relative: 9 % (ref 3.0–12.0)
Neutro Abs: 4.1 10*3/uL (ref 1.4–7.7)
Neutrophils Relative %: 57.8 % (ref 43.0–77.0)
Platelets: 323 10*3/uL (ref 150.0–400.0)
RBC: 5 Mil/uL (ref 3.87–5.11)
RDW: 13.4 % (ref 11.5–15.5)
WBC: 7 10*3/uL (ref 4.0–10.5)

## 2022-10-20 LAB — TSH: TSH: 0.14 u[IU]/mL — ABNORMAL LOW (ref 0.35–5.50)

## 2022-10-20 LAB — HEMOGLOBIN A1C: Hgb A1c MFr Bld: 5.5 % (ref 4.6–6.5)

## 2022-10-20 MED ORDER — ZEPBOUND 7.5 MG/0.5ML ~~LOC~~ SOAJ
7.5000 mg | SUBCUTANEOUS | 1 refills | Status: DC
  Filled 2022-10-20 – 2023-02-16 (×5): qty 2, 28d supply, fill #0
  Filled 2023-03-28: qty 2, 28d supply, fill #1
  Filled 2023-04-22 – 2023-04-24 (×2): qty 2, 28d supply, fill #2

## 2022-10-20 MED ORDER — METFORMIN HCL 500 MG PO TABS: 500.0000 mg | ORAL_TABLET | Freq: Two times a day (BID) | ORAL | 1 refills | Status: DC

## 2022-10-20 MED ORDER — LEVOTHYROXINE SODIUM 112 MCG PO TABS: 112.0000 ug | ORAL_TABLET | Freq: Every day | ORAL | 1 refills | Status: DC

## 2022-10-20 NOTE — Progress Notes (Signed)
Labs better except Thyroid dose too high-does she want to decrease to 0.1 mg/day and reck 2 months or skip dose once/week(s) and reck 2-3 months?

## 2022-10-20 NOTE — Progress Notes (Signed)
Phone [phone]   Subjective:   Patient is a 67 y.o. female presenting for annual physical.    Chief Complaint  Patient presents with   Annual Exam    CPE Fasting    Annual-exercising.  Working on diet.  Hypothyroidism-on 112.  Doing well.  No tremors PreDM-taking metformin daily Obesity-doing very well on 7.5 mg zepbound.  Wants to continue.  Diets/exercises.   See problem oriented charting- ROS- ROS: Gen: no fever, chills  Skin: no rash, itching ENT: no ear pain, ear drainage, nasal congestion, rhinorrhea, sinus pressure, sore throat Eyes: no blurry vision, double vision Resp: no cough, wheeze,SOB CV: no CP, palpitations, LE edema,  GI: no heartburn, n/v/d/c, abd pain GU: no dysuria, urgency, frequency, hematuria MSK: no joint pain, myalgias, back pain Neuro: no dizziness, headache, weakness, vertigo Psych: no depression, anxiety, insomnia, SI   The following were reviewed and entered/updated in epic: Past Medical History:  Diagnosis Date   Arthritis one year ago   Blood transfusion without reported diagnosis    Colon polyps    Hypothyroidism    Uterine cancer    age 51   Patient Active Problem List   Diagnosis Date Noted   Prediabetes 06/15/2022   Family history of colon cancer in father 08/17/2020   Hyperlipidemia-ASCVD risk 3.6% 08/19/2019   Hypothyroidism 08/16/2019   History of uterine cancer 08/16/2019   Past Surgical History:  Procedure Laterality Date   ABDOMINAL HYSTERECTOMY  Th[age]- years ago   COLONOSCOPY  12/25/2015   in [place], [place] polyp-adenoma   hysterectomy and oopherectomy  Bilateral 1983   age 46 years.  endomet ca    Family History  Problem Relation Age of Onset   Diabetes Mother    Cancer Mother 73       panc ca   Kidney disease Mother    Colon cancer Father 32   Cancer Father 67       colon   Colon polyps Brother    Breast cancer Maternal Aunt    Breast cancer Paternal Aunt    BRCA 1/2 Cousin    BRCA 1/2 Cousin     Cancer Niece 15       colon ca   Stomach cancer Niece    Rectal cancer Niece    Esophageal cancer Niece    Colon cancer Niece 62    Medications- reviewed and updated Current Outpatient Medications  Medication Sig Dispense Refill   Cholecalciferol (VITAMIN D) 50 MCG (2000 UT) tablet Take 2,000 Units by mouth daily.     levothyroxine (SYNTHROID) 112 MCG tablet Take 1 tablet (112 mcg total) by mouth daily before breakfast. 90 tablet 1   metFORMIN (GLUCOPHAGE) 500 MG tablet Take 1 tablet (500 mg total) by mouth 2 (two) times daily with a meal. 180 tablet 1   tirzepatide (ZEPBOUND) 7.5 MG/0.5ML Pen Inject 7.5 mg into the skin once a week. 6 mL 1   Current Facility-Administered Medications  Medication Dose Route Frequency Provider Last Rate Last Admin   0.9 %  sodium chloride infusion  500 mL Intravenous Once Rachael Fee, MD        Allergies-reviewed and updated No Known Allergies  Social History   Social History Narrative   2 adopted.  Retired annunities   Objective  Objective:  BP 112/60   Pulse 64   Temp 97.8 F (36.6 C) (Temporal)   Resp 16   Ht 5' 4.5" (1.638 m)   Wt 164 lb 4 oz (74.5  kg)   SpO2 99%   BMI 27.76 kg/m  Physical Exam  Gen: WDWN NAD HEENT: NCAT, conjunctiva not injected, sclera nonicteric TM WNL B, OP moist, no exudates  NECK:  supple, no thyromegaly, no nodes, no carotid bruits CARDIAC: RRR, S1S2+, no murmur. DP 2+B LUNGS: CTAB. No wheezes ABDOMEN:  BS+, soft, NTND, No HSM, no masses EXT:  no edema MSK: no gross abnormalities. MS 5/5 all 4 NEURO: A&O x3.  CN II-XII intact.  PSYCH: normal mood. Good eye contact     Assessment and Plan   Health Maintenance counseling: 1. Anticipatory guidance: Patient counseled regarding regular dental exams q6 months, eye exams,  avoiding smoking and second hand smoke, limiting alcohol to 1 beverage per day, no illicit drugs.   2. Risk factor reduction:  Advised patient of need for regular exercise and  diet rich and fruits and vegetables to reduce risk of heart attack and stroke. Exercise- +.  Wt Readings from Last 3 Encounters:  10/20/22 164 lb 4 oz (74.5 kg)  06/15/22 186 lb 2 oz (84.4 kg)  12/20/21 187 lb 12.8 oz (85.2 kg)   3. Immunizations/screenings/ancillary studies Immunization History  Administered Date(s) Administered   PFIZER(Purple Top)SARS-COV-2 Vaccination 10/03/2019, 10/28/2019, 06/11/2020   Tdap 10/11/2021   Health Maintenance Due  Topic Date Due   MAMMOGRAM  10/08/2022    4. Cervical cancer screening- hyst 5. Breast cancer screening-  mammogram ordered 6. Colon cancer screening - utd 7. Skin cancer screening- advised regular sunscreen use. Denies worrisome, changing, or new skin lesions.  8. Birth control/STD check- n/a 9. Osteoporosis screening- ordered 10. Smoking associated screening - non smoker  Wellness examination -     Hemoglobin A1c -     CBC with Differential/Platelet -     Comprehensive metabolic panel -     Lipid panel -     TSH  Acquired hypothyroidism -     TSH  Prediabetes -     Hemoglobin A1c  Estrogen deficiency -     DG Bone Density; Future  Other specified hypothyroidism -     Levothyroxine Sodium; Take 1 tablet (112 mcg total) by mouth daily before breakfast.  Dispense: 90 tablet; Refill: 1  Encounter for screening mammogram for malignant neoplasm of breast -     3D Screening Mammogram, Left and Right; Future  Overweight with body mass index (BMI) of 27 to 27.9 in adult  Other orders -     metFORMIN HCl; Take 1 tablet (500 mg total) by mouth 2 (two) times daily with a meal.  Dispense: 180 tablet; Refill: 1 -     Zepbound; Inject 7.5 mg into the skin once a week.  Dispense: 6 mL; Refill: 1   Wellness-anticipatory guidance.  Work on Diet/Exercise  Check CBC,CMP,lipids,TSH, A1C.  F/u 1 yr  Hypothyroidism-chronic.  Controlled.  Continue synthroid 112 PreDM-chronic.  Controlled.  Check A1C.  Continue diet/exercise.  Metformin 500  mg daily-twice daily Estrogen deficiency. Check dxa Obesity-chronic  has lost/maintained on zepbound 7.5 mg weekly.  Continue.  Follow up 63mo Follow up 63mo   Recommended follow up: Return in about 6 months (around 04/21/2023) for thyroid, preDM.  Lab/Order associations:+ fasting  Angelena Sole, MD

## 2022-10-20 NOTE — Patient Instructions (Signed)
It was very nice to see you today!  Keep active   PLEASE NOTE:  If you had any lab tests please let us know if you have not heard back within a few days. You may see your results on MyChart before we have a chance to review them but we will give you a call once they are reviewed by us. If we ordered any referrals today, please let us know if you have not heard from their office within the next week.   Please try these tips to maintain a healthy lifestyle:  Eat most of your calories during the day when you are active. Eliminate processed foods including packaged sweets (pies, cakes, cookies), reduce intake of potatoes, white bread, white pasta, and white rice. Look for whole grain options, oat flour or almond flour.  Each meal should contain half fruits/vegetables, one quarter protein, and one quarter carbs (no bigger than a computer mouse).  Cut down on sweet beverages. This includes juice, soda, and sweet tea. Also watch fruit intake, though this is a healthier sweet option, it still contains natural sugar! Limit to 3 servings daily.  Drink at least 1 glass of water with each meal and aim for at least 8 glasses per day  Exercise at least 150 minutes every week.   

## 2022-10-21 ENCOUNTER — Other Ambulatory Visit: Payer: Self-pay | Admitting: *Deleted

## 2022-10-21 ENCOUNTER — Telehealth: Payer: Self-pay | Admitting: Family Medicine

## 2022-10-21 DIAGNOSIS — E7800 Pure hypercholesterolemia, unspecified: Secondary | ICD-10-CM

## 2022-10-21 DIAGNOSIS — R7989 Other specified abnormal findings of blood chemistry: Secondary | ICD-10-CM

## 2022-10-21 NOTE — Progress Notes (Signed)
I didn't see any family history heart disease, her HDL is over 60 so protective.  LDL <130 so ok.  But if she wants to take medications to reduce risk, we can start Lipitor 10 mg daily and reck lipids/LFT's w/the thyroid.

## 2022-10-21 NOTE — Telephone Encounter (Signed)
Returned call to patient, went over lab results and recommendations.  

## 2022-10-21 NOTE — Telephone Encounter (Signed)
Pt would like a call back about the lab message and also, about her cholesterol, it was high before she lost weight and wants to know why its still high after losing weight. Please advise.

## 2022-10-21 NOTE — Telephone Encounter (Signed)
Patient returned call. Requests to be called. 

## 2022-10-27 ENCOUNTER — Encounter (HOSPITAL_BASED_OUTPATIENT_CLINIC_OR_DEPARTMENT_OTHER): Payer: Self-pay | Admitting: Pharmacist

## 2022-10-27 ENCOUNTER — Other Ambulatory Visit (HOSPITAL_BASED_OUTPATIENT_CLINIC_OR_DEPARTMENT_OTHER): Payer: Self-pay

## 2022-11-04 ENCOUNTER — Ambulatory Visit (HOSPITAL_BASED_OUTPATIENT_CLINIC_OR_DEPARTMENT_OTHER)
Admission: RE | Admit: 2022-11-04 | Discharge: 2022-11-04 | Disposition: A | Discharge status: Home / Self Care | Payer: BC Managed Care – PPO | Source: Ambulatory Visit | Attending: Family Medicine | Admitting: Family Medicine

## 2022-11-04 DIAGNOSIS — E2839 Other primary ovarian failure: Secondary | ICD-10-CM

## 2022-11-04 DIAGNOSIS — Z1231 Encounter for screening mammogram for malignant neoplasm of breast: Secondary | ICD-10-CM | POA: Insufficient documentation

## 2022-11-06 NOTE — Progress Notes (Signed)
Has osteopenia(stage before osteoporosis).  Continue exercise(at least walking daily) and take calcium 600/vitamin D 400 twice daily(can't do more than 600 calcium at once as body won't absorb more)

## 2022-11-07 ENCOUNTER — Other Ambulatory Visit (HOSPITAL_BASED_OUTPATIENT_CLINIC_OR_DEPARTMENT_OTHER): Payer: Self-pay

## 2022-11-07 ENCOUNTER — Other Ambulatory Visit: Payer: Self-pay

## 2022-11-08 ENCOUNTER — Other Ambulatory Visit: Payer: Self-pay | Admitting: Family Medicine

## 2022-11-08 ENCOUNTER — Other Ambulatory Visit (HOSPITAL_BASED_OUTPATIENT_CLINIC_OR_DEPARTMENT_OTHER): Payer: Self-pay

## 2022-11-08 ENCOUNTER — Telehealth: Payer: Self-pay | Admitting: Family Medicine

## 2022-11-08 MED ORDER — ZEPBOUND 5 MG/0.5ML ~~LOC~~ SOAJ
5.0000 mg | SUBCUTANEOUS | 0 refills | Status: DC
  Filled 2022-11-08: qty 2, 28d supply, fill #0

## 2022-11-08 NOTE — Telephone Encounter (Signed)
Patient states Pharmacy is out of stock of  tirzepatide (ZEPBOUND) 7.5 MG/0.5ML Pen.  Requests RX for Zepbound 5.0 MG be sent to:   [facility] Phone: [phone]  Fax: [phone]

## 2022-11-08 NOTE — Telephone Encounter (Signed)
Noted  

## 2022-11-18 ENCOUNTER — Other Ambulatory Visit (HOSPITAL_BASED_OUTPATIENT_CLINIC_OR_DEPARTMENT_OTHER): Payer: Self-pay

## 2022-12-23 ENCOUNTER — Other Ambulatory Visit: Payer: Self-pay | Admitting: Family Medicine

## 2022-12-23 ENCOUNTER — Encounter: Payer: BC Managed Care – PPO | Admitting: Family Medicine

## 2022-12-23 ENCOUNTER — Other Ambulatory Visit (HOSPITAL_BASED_OUTPATIENT_CLINIC_OR_DEPARTMENT_OTHER): Payer: Self-pay

## 2022-12-23 MED ORDER — ZEPBOUND 5 MG/0.5ML ~~LOC~~ SOAJ
5.0000 mg | SUBCUTANEOUS | 0 refills | Status: DC
  Filled 2022-12-23: qty 2, 28d supply, fill #0

## 2023-01-10 ENCOUNTER — Other Ambulatory Visit: Payer: Self-pay | Admitting: *Deleted

## 2023-01-10 MED ORDER — METFORMIN HCL 500 MG PO TABS: 500.0000 mg | ORAL_TABLET | Freq: Two times a day (BID) | ORAL | 1 refills | Status: DC

## 2023-01-24 ENCOUNTER — Other Ambulatory Visit: Payer: Self-pay | Admitting: *Deleted

## 2023-01-24 ENCOUNTER — Other Ambulatory Visit: Payer: BC Managed Care – PPO

## 2023-01-24 DIAGNOSIS — R7989 Other specified abnormal findings of blood chemistry: Secondary | ICD-10-CM | POA: Diagnosis not present

## 2023-01-24 DIAGNOSIS — E7800 Pure hypercholesterolemia, unspecified: Secondary | ICD-10-CM

## 2023-01-24 LAB — HEPATIC FUNCTION PANEL
ALT: 15 U/L (ref 0–35)
AST: 19 U/L (ref 0–37)
Albumin: 4.2 g/dL (ref 3.5–5.2)
Alkaline Phosphatase: 70 U/L (ref 39–117)
Bilirubin, Direct: 0.1 mg/dL (ref 0.0–0.3)
Total Bilirubin: 0.7 mg/dL (ref 0.2–1.2)
Total Protein: 6.7 g/dL (ref 6.0–8.3)

## 2023-01-24 LAB — LIPID PANEL
Cholesterol: 230 mg/dL — ABNORMAL HIGH (ref 0–200)
HDL: 63.7 mg/dL (ref >39.00)
LDL Cholesterol: 139 mg/dL — ABNORMAL HIGH (ref 0–99)
NonHDL: 166.31
Total CHOL/HDL Ratio: 4
Triglycerides: 136 mg/dL (ref 0.0–149.0)
VLDL: 27.2 mg/dL (ref 0.0–40.0)

## 2023-01-24 LAB — TSH: TSH: 9.07 u[IU]/mL — ABNORMAL HIGH (ref 0.35–5.50)

## 2023-01-24 MED ORDER — LEVOTHYROXINE SODIUM 100 MCG PO TABS: 100.0000 ug | ORAL_TABLET | Freq: Every day | ORAL | 0 refills | Status: DC

## 2023-01-25 ENCOUNTER — Encounter (INDEPENDENT_AMBULATORY_CARE_PROVIDER_SITE_OTHER): Payer: Self-pay

## 2023-02-16 ENCOUNTER — Other Ambulatory Visit (HOSPITAL_BASED_OUTPATIENT_CLINIC_OR_DEPARTMENT_OTHER): Payer: Self-pay

## 2023-02-16 ENCOUNTER — Other Ambulatory Visit: Payer: Self-pay | Admitting: Physician Assistant

## 2023-02-22 ENCOUNTER — Ambulatory Visit: Payer: BC Managed Care – PPO | Admitting: Family Medicine

## 2023-02-22 ENCOUNTER — Encounter: Payer: Self-pay | Admitting: Family Medicine

## 2023-02-22 VITALS — BP 100/64 | HR 59 | Temp 97.6°F | Resp 16 | Ht 64.5 in | Wt 161.2 lb

## 2023-02-22 DIAGNOSIS — E782 Mixed hyperlipidemia: Secondary | ICD-10-CM

## 2023-02-22 DIAGNOSIS — E034 Atrophy of thyroid (acquired): Secondary | ICD-10-CM | POA: Diagnosis not present

## 2023-02-22 DIAGNOSIS — R7303 Prediabetes: Secondary | ICD-10-CM | POA: Diagnosis not present

## 2023-02-22 LAB — TSH: TSH: 1.19 u[IU]/mL (ref 0.35–5.50)

## 2023-02-22 NOTE — Progress Notes (Signed)
Much better.  Same dose

## 2023-02-22 NOTE — Patient Instructions (Signed)
It was very nice to see you today!  Colace 100-300mg /day.  Miralax daily   PLEASE NOTE:  If you had any lab tests please let us know if you have not heard back within a few days. You may see your results on MyChart before we have a chance to review them but we will give you a call once they are reviewed by [place]. If we ordered any referrals today, please let us know if you have not heard from their office within the next week.   Please try these tips to maintain a healthy lifestyle:  Eat most of your calories during the day when you are active. Eliminate processed foods including packaged sweets (pies, cakes, cookies), reduce intake of potatoes, white bread, white pasta, and white rice. Look for whole grain options, oat flour or almond flour.  Each meal should contain half fruits/vegetables, one quarter protein, and one quarter carbs (no bigger than a computer mouse).  Cut down on sweet beverages. This includes juice, soda, and sweet tea. Also watch fruit intake, though this is a healthier sweet option, it still contains natural sugar! Limit to 3 servings daily.  Drink at least 1 glass of water with each meal and aim for at least 8 glasses per day  Exercise at least 150 minutes every week.

## 2023-02-22 NOTE — Assessment & Plan Note (Signed)
Chronic.  Controlled on diet/exercise/zepbound 7.5 and metformin 500mg  daily.  Can inc to bid to see if helps constipation.  Take colace and miralax for constipation prevention.

## 2023-02-22 NOTE — Progress Notes (Signed)
Subjective:    Patient ID: Wendy Payne, female    DOB: 1956-05-03, 67 y.o.   MRN: [id number]  Chief Complaint  Patient presents with   Follow-up    Follow-up on thyroid Not fasting   HPI Thyroid - Patient states she has been compliant with 100 mcg synthroid daily for about one month. Endorses accompanied hot flashes.  Denies shakiness, cp, sob, or palpations. When on 112-was supposed to skip 1 day per wk, but then kept forgetting to take it at all.    HLD - Reports concerns about her lipid panel results. States she would like to continue efforts to exercise and follow a healthy diet before starting medication.   Pre DM - Compliant with Zepbound 7.5 mg injections and 500 mg Metformin, states she is only taking Metformin once daily. Blood sugars running unchecked. Denies shakiness and nausea.   Osteopenia - States she has been taking calcium 600 mg, vitamin D, and B 12 , but would like to find smaller calcium capsules easier to swallow.    Constipation - Endorsees intermittent constipation. States she is taking Dulcolax and Exlax for relief.   Discussed Labs  There are no preventive care reminders to display for this patient.   Past Medical History:  Diagnosis Date   Arthritis one year ago   Blood transfusion without reported diagnosis    Colon polyps    Hypothyroidism    Uterine cancer [facility])    age 25    Past Surgical History:  Procedure Laterality Date   ABDOMINAL HYSTERECTOMY  Thirty-five- years ago   COLONOSCOPY  12/25/2015   in [place], [place] polyp-adenoma   hysterectomy and oopherectomy  Bilateral 1983   age 33 years.  endomet ca     Current Outpatient Medications:    Cholecalciferol (VITAMIN D) 50 MCG (2000 UT) tablet, Take 2,000 Units by mouth daily., Disp: , Rfl:    levothyroxine (SYNTHROID) 100 MCG tablet, TAKE 1 TABLET BY MOUTH EVERY DAY, Disp: 90 tablet, Rfl: 0   metFORMIN (GLUCOPHAGE) 500 MG tablet, Take 1 tablet (500 mg total) by mouth 2 (two) times  daily with a meal. (Patient taking differently: Take 500 mg by mouth daily with breakfast.), Disp: 180 tablet, Rfl: 1   tirzepatide (ZEPBOUND) 7.5 MG/0.5ML Pen, Inject 7.5 mg into the skin once a week., Disp: 6 mL, Rfl: 1  Current Facility-Administered Medications:    0.9 %  sodium chloride infusion, 500 mL, Intravenous, Once, Rachael Fee, MD  No Known Allergies ROS neg/noncontributory except as noted HPI/below      Objective:     BP 100/64   Pulse (!) 59   Temp 97.6 F (36.4 C) (Temporal)   Resp 16   Ht 5' 4.5" (1.638 m)   Wt 161 lb 4 oz (73.1 kg)   SpO2 99%   BMI 27.25 kg/m  Wt Readings from Last 3 Encounters:  02/22/23 161 lb 4 oz (73.1 kg)  10/20/22 164 lb 4 oz (74.5 kg)  06/15/22 186 lb 2 oz (84.4 kg)    Physical Exam   Gen: WDWN NAD HEENT: NCAT, conjunctiva not injected, sclera nonicteric CARDIAC: RRR, S1S2+, no murmur. LUNGS: CTAB. No wheezes EXT:  no edema MSK: no gross abnormalities.  NEURO: A&O x3.  CN II-XII intact.  PSYCH: normal mood. Good eye contact     Assessment & Plan:  Hypothyroidism due to acquired atrophy of thyroid Assessment & Plan: Chronic.  Uncontrolled(had missed doses, now on 100).  Continue synthroid 100.  Check labs.  Pt taking correctly  Orders: -     TSH -     TSH; Future  Prediabetes Assessment & Plan: Chronic.  Controlled on diet/exercise/zepbound 7.5 and metformin 500mg  daily.  Can inc to bid to see if helps constipation.  Take colace and miralax for constipation prevention.   Orders: -     Comprehensive metabolic panel; Future -     Hemoglobin A1c; Future  Mixed hyperlipidemia Assessment & Plan: Chronic.  HDL > 60.  Will continue diet/exercise, zepbound.  Reck 3 mo.  If not <130 LDL will discuss meds  Orders: -     Comprehensive metabolic panel; Future -     Lipid panel; Future    Return in about 3 months (around 05/25/2023) for cholesterol-me 3 mo and labs sev days prior.   Germaine Pomfret Rice,acting as a  scribe for Angelena Sole, MD.,have documented all relevant documentation on the behalf of Angelena Sole, MD,as directed by  Angelena Sole, MD while in the presence of Angelena Sole, MD.  I, Angelena Sole, MD, have reviewed all documentation for this visit. The documentation on 02/22/23 for the exam, diagnosis, procedures, and orders are all accurate and complete.    Angelena Sole, MD

## 2023-02-22 NOTE — Assessment & Plan Note (Signed)
Chronic.  HDL > 60.  Will continue diet/exercise, zepbound.  Reck 3 mo.  If not <130 LDL will discuss meds

## 2023-02-22 NOTE — Assessment & Plan Note (Signed)
Chronic.  Uncontrolled(had missed doses, now on 100).  Continue synthroid 100.  Check labs.  Pt taking correctly

## 2023-03-29 ENCOUNTER — Other Ambulatory Visit (HOSPITAL_BASED_OUTPATIENT_CLINIC_OR_DEPARTMENT_OTHER): Payer: Self-pay

## 2023-03-30 ENCOUNTER — Other Ambulatory Visit (HOSPITAL_BASED_OUTPATIENT_CLINIC_OR_DEPARTMENT_OTHER): Payer: Self-pay

## 2023-04-06 ENCOUNTER — Other Ambulatory Visit: Payer: Self-pay | Admitting: Family Medicine

## 2023-04-24 ENCOUNTER — Other Ambulatory Visit (HOSPITAL_BASED_OUTPATIENT_CLINIC_OR_DEPARTMENT_OTHER): Payer: Self-pay

## 2023-04-25 ENCOUNTER — Other Ambulatory Visit (HOSPITAL_BASED_OUTPATIENT_CLINIC_OR_DEPARTMENT_OTHER): Payer: Self-pay

## 2023-05-22 ENCOUNTER — Other Ambulatory Visit (INDEPENDENT_AMBULATORY_CARE_PROVIDER_SITE_OTHER): Payer: BC Managed Care – PPO

## 2023-05-22 DIAGNOSIS — R7303 Prediabetes: Secondary | ICD-10-CM

## 2023-05-22 DIAGNOSIS — E034 Atrophy of thyroid (acquired): Secondary | ICD-10-CM

## 2023-05-22 DIAGNOSIS — E782 Mixed hyperlipidemia: Secondary | ICD-10-CM

## 2023-05-22 LAB — COMPREHENSIVE METABOLIC PANEL
ALT: 20 U/L (ref 0–35)
AST: 24 U/L (ref 0–37)
Albumin: 4.2 g/dL (ref 3.5–5.2)
Alkaline Phosphatase: 67 U/L (ref 39–117)
BUN: 16 mg/dL (ref 6–23)
CO2: 28 meq/L (ref 19–32)
Calcium: 9.1 mg/dL (ref 8.4–10.5)
Chloride: 106 meq/L (ref 96–112)
Creatinine, Ser: 0.86 mg/dL (ref 0.40–1.20)
GFR: 70.97 mL/min (ref >60.00)
Glucose, Bld: 79 mg/dL (ref 70–99)
Potassium: 3.9 meq/L (ref 3.5–5.1)
Sodium: 140 meq/L (ref 135–145)
Total Bilirubin: 0.7 mg/dL (ref 0.2–1.2)
Total Protein: 6.6 g/dL (ref 6.0–8.3)

## 2023-05-22 LAB — LIPID PANEL
Cholesterol: 217 mg/dL — ABNORMAL HIGH (ref 0–200)
HDL: 63.7 mg/dL (ref >39.00)
LDL Cholesterol: 136 mg/dL — ABNORMAL HIGH (ref 0–99)
NonHDL: 153.48
Total CHOL/HDL Ratio: 3
Triglycerides: 86 mg/dL (ref 0.0–149.0)
VLDL: 17.2 mg/dL (ref 0.0–40.0)

## 2023-05-22 LAB — HEMOGLOBIN A1C: Hgb A1c MFr Bld: 5.6 % (ref 4.6–6.5)

## 2023-05-22 LAB — TSH: TSH: 1.23 u[IU]/mL (ref 0.35–5.50)

## 2023-05-22 NOTE — Progress Notes (Signed)
Thyroid correct now.  Will discuss labs more at appt on 12/2

## 2023-05-29 ENCOUNTER — Other Ambulatory Visit (HOSPITAL_BASED_OUTPATIENT_CLINIC_OR_DEPARTMENT_OTHER): Payer: Self-pay

## 2023-05-29 ENCOUNTER — Encounter: Payer: Self-pay | Admitting: Family Medicine

## 2023-05-29 ENCOUNTER — Ambulatory Visit: Payer: BC Managed Care – PPO | Admitting: Family Medicine

## 2023-05-29 VITALS — BP 100/60 | HR 60 | Temp 97.9°F | Resp 18 | Ht 64.5 in | Wt 160.0 lb

## 2023-05-29 DIAGNOSIS — E782 Mixed hyperlipidemia: Secondary | ICD-10-CM

## 2023-05-29 DIAGNOSIS — E039 Hypothyroidism, unspecified: Secondary | ICD-10-CM | POA: Diagnosis not present

## 2023-05-29 DIAGNOSIS — R7303 Prediabetes: Secondary | ICD-10-CM

## 2023-05-29 MED ORDER — ZEPBOUND 7.5 MG/0.5ML ~~LOC~~ SOAJ
7.5000 mg | SUBCUTANEOUS | 5 refills | Status: DC
  Filled 2023-05-29: qty 2, 28d supply, fill #0
  Filled 2023-07-19: qty 2, 28d supply, fill #1
  Filled 2023-08-18: qty 2, 28d supply, fill #2
  Filled 2023-09-20: qty 2, 28d supply, fill #3
  Filled 2023-11-09 – 2023-11-13 (×2): qty 2, 28d supply, fill #4

## 2023-05-29 MED ORDER — LEVOTHYROXINE SODIUM 100 MCG PO TABS
100.0000 ug | ORAL_TABLET | Freq: Every day | ORAL | 3 refills | Status: DC
Start: 2023-05-29 — End: 2024-01-30

## 2023-05-29 MED ORDER — METFORMIN HCL 500 MG PO TABS
500.0000 mg | ORAL_TABLET | Freq: Every day | ORAL | 1 refills | Status: DC
Start: 2023-05-29 — End: 2023-11-29

## 2023-05-29 NOTE — Assessment & Plan Note (Signed)
Chronic. controlled.  Continue synthroid 100.  Pt taking correctly

## 2023-05-29 NOTE — Patient Instructions (Signed)

## 2023-05-29 NOTE — Assessment & Plan Note (Signed)
Chronic.  Controlled on diet/exercise/zepbound 7.5 and metformin 500mg  daily  Take colace and miralax for constipation prevention.

## 2023-05-29 NOTE — Assessment & Plan Note (Signed)
Chronic.  HDL > 60.  Will continue diet/exercise, zepbound. Pt prefers no meds.

## 2023-05-29 NOTE — Progress Notes (Signed)
Subjective:    Patient ID: Wendy Payne, female    DOB: 1955-10-15, 67 y.o.   MRN: [id number]  Chief Complaint  Patient presents with   Medical Management of Chronic Issues    3 month follow-up on cholesterol and thyroid  Not fasting     HPI Thyroid - Patient states she has been compliant with 100 mcg synthroid daily  Endorses accompanied hot flashes.  Denies shakiness, cp, sob, or palpations. TSH 1.23 HLD - Reports concerns about her lipid panel results. States she would like to continue efforts to exercise and follow a healthy diet before starting medication. On zepbound 7.5mg  weekly.  Remote fh cad.  No cva.   Pre DM - Compliant with Zepbound 7.5 mg injections-every other week and 500 mg Metformin, states she is only taking Metformin once daily. Blood sugars running unchecked. Denies shakiness and nausea.   Osteopenia - States she has been taking calcium 600 mg, vitamin D, and B 12  Constipation - Endorsees intermittent constipation. States she is taking colace and miralax for relief.   Discussed Labs  There are no preventive care reminders to display for this patient.    Past Medical History:  Diagnosis Date   Arthritis one year ago   Blood transfusion without reported diagnosis    Colon polyps    Hypothyroidism    Uterine cancer [facility])    age 35    Past Surgical History:  Procedure Laterality Date   ABDOMINAL HYSTERECTOMY  Thirty-five- years ago   COLONOSCOPY  12/25/2015   in [place], [place] polyp-adenoma   hysterectomy and oopherectomy  Bilateral 1983   age 83 years.  endomet ca     Current Outpatient Medications:    Cholecalciferol (VITAMIN D) 50 MCG (2000 UT) tablet, Take 2,000 Units by mouth daily., Disp: , Rfl:    levothyroxine (SYNTHROID) 100 MCG tablet, Take 1 tablet (100 mcg total) by mouth daily., Disp: 90 tablet, Rfl: 3   metFORMIN (GLUCOPHAGE) 500 MG tablet, Take 1 tablet (500 mg total) by mouth daily with breakfast., Disp: 90 tablet, Rfl: 1    tirzepatide (ZEPBOUND) 7.5 MG/0.5ML Pen, Inject 7.5 mg into the skin once a week., Disp: 2 mL, Rfl: 5  No Known Allergies ROS neg/noncontributory except as noted HPI/below      Objective:     BP 100/60   Pulse 60   Temp 97.9 F (36.6 C) (Temporal)   Resp 18   Ht 5' 4.5" (1.638 m)   Wt 160 lb (72.6 kg)   SpO2 99%   BMI 27.04 kg/m  Wt Readings from Last 3 Encounters:  05/29/23 160 lb (72.6 kg)  02/22/23 161 lb 4 oz (73.1 kg)  10/20/22 164 lb 4 oz (74.5 kg)    Physical Exam   Gen: WDWN NAD HEENT: NCAT, conjunctiva not injected, sclera nonicteric CARDIAC: RRR, S1S2+, no murmur. LUNGS: CTAB. No wheezes Abd: BS +, soft, NTND, no HSM EXT:  no edema MSK: no gross abnormalities.  NEURO: A&O x3.  CN II-XII intact.  PSYCH: normal mood. Good eye contact  Reviewed labs from 05/22/23.  LDL 136(was 139)     Assessment & Plan:  Mixed hyperlipidemia Assessment & Plan: Chronic.  HDL > 60.  Will continue diet/exercise, zepbound. Pt prefers no meds.  Orders: -     Comprehensive metabolic panel; Future -     Lipid panel; Future  Acquired hypothyroidism Assessment & Plan: Chronic. controlled.  Continue synthroid 100.  Pt taking correctly  Orders: -  Levothyroxine Sodium; Take 1 tablet (100 mcg total) by mouth daily.  Dispense: 90 tablet; Refill: 3 -     TSH; Future  Prediabetes Assessment & Plan: Chronic.  Controlled on diet/exercise/zepbound 7.5 and metformin 500mg  daily  Take colace and miralax for constipation prevention.   Orders: -     Comprehensive metabolic panel; Future -     metFORMIN HCl; Take 1 tablet (500 mg total) by mouth daily with breakfast.  Dispense: 90 tablet; Refill: 1 -     Zepbound; Inject 7.5 mg into the skin once a week.  Dispense: 2 mL; Refill: 5 -     Hemoglobin A1c; Future    Return in about 6 months (around 11/27/2023) for chronic follow-up,  labs prior fasting.    Angelena Sole, MD

## 2023-05-30 ENCOUNTER — Telehealth: Payer: Self-pay | Admitting: Family Medicine

## 2023-05-30 NOTE — Telephone Encounter (Signed)
Prescription Request  05/30/2023  LOV: 05/29/2023  What is the name of the medication or equipment?  levothyroxine (SYNTHROID) 100 MCG tablet   Have you contacted your pharmacy to request a refill? No   Which pharmacy would you like this sent to? [id number] [place], [place] - [address] [place] [address] Phone: [phone] Fax: [phone]   Patient notified that their request is being sent to the clinical staff for review and that they should receive a response within 2 business days.   Please advise at Mobile [phone] (mobile)

## 2023-07-19 ENCOUNTER — Other Ambulatory Visit (HOSPITAL_BASED_OUTPATIENT_CLINIC_OR_DEPARTMENT_OTHER): Payer: Self-pay

## 2023-08-18 ENCOUNTER — Other Ambulatory Visit (HOSPITAL_BASED_OUTPATIENT_CLINIC_OR_DEPARTMENT_OTHER): Payer: Self-pay

## 2023-08-23 ENCOUNTER — Other Ambulatory Visit (HOSPITAL_BASED_OUTPATIENT_CLINIC_OR_DEPARTMENT_OTHER): Payer: Self-pay

## 2023-09-21 ENCOUNTER — Other Ambulatory Visit (HOSPITAL_BASED_OUTPATIENT_CLINIC_OR_DEPARTMENT_OTHER): Payer: Self-pay

## 2023-09-22 ENCOUNTER — Other Ambulatory Visit (HOSPITAL_BASED_OUTPATIENT_CLINIC_OR_DEPARTMENT_OTHER): Payer: Self-pay

## 2023-10-29 ENCOUNTER — Other Ambulatory Visit (HOSPITAL_BASED_OUTPATIENT_CLINIC_OR_DEPARTMENT_OTHER): Payer: Self-pay | Admitting: Family Medicine

## 2023-10-29 DIAGNOSIS — Z1231 Encounter for screening mammogram for malignant neoplasm of breast: Secondary | ICD-10-CM

## 2023-11-06 ENCOUNTER — Ambulatory Visit (HOSPITAL_BASED_OUTPATIENT_CLINIC_OR_DEPARTMENT_OTHER)
Admission: RE | Admit: 2023-11-06 | Discharge: 2023-11-06 | Disposition: A | Discharge status: Home / Self Care | Source: Ambulatory Visit | Attending: Family Medicine | Admitting: Family Medicine

## 2023-11-06 ENCOUNTER — Encounter (HOSPITAL_BASED_OUTPATIENT_CLINIC_OR_DEPARTMENT_OTHER): Payer: Self-pay | Admitting: Radiology

## 2023-11-06 DIAGNOSIS — Z1231 Encounter for screening mammogram for malignant neoplasm of breast: Secondary | ICD-10-CM | POA: Diagnosis present

## 2023-11-08 ENCOUNTER — Ambulatory Visit: Payer: Self-pay | Admitting: Family Medicine

## 2023-11-09 ENCOUNTER — Other Ambulatory Visit (HOSPITAL_BASED_OUTPATIENT_CLINIC_OR_DEPARTMENT_OTHER): Payer: Self-pay

## 2023-11-13 ENCOUNTER — Other Ambulatory Visit (HOSPITAL_BASED_OUTPATIENT_CLINIC_OR_DEPARTMENT_OTHER): Payer: Self-pay

## 2023-11-22 ENCOUNTER — Ambulatory Visit: Payer: Self-pay | Admitting: Family Medicine

## 2023-11-22 ENCOUNTER — Other Ambulatory Visit (INDEPENDENT_AMBULATORY_CARE_PROVIDER_SITE_OTHER): Payer: BC Managed Care – PPO

## 2023-11-22 DIAGNOSIS — R7303 Prediabetes: Secondary | ICD-10-CM

## 2023-11-22 DIAGNOSIS — E039 Hypothyroidism, unspecified: Secondary | ICD-10-CM

## 2023-11-22 DIAGNOSIS — E782 Mixed hyperlipidemia: Secondary | ICD-10-CM

## 2023-11-22 LAB — LIPID PANEL
Cholesterol: 210 mg/dL — ABNORMAL HIGH (ref 0–200)
HDL: 71.1 mg/dL (ref >39.00)
LDL Cholesterol: 120 mg/dL — ABNORMAL HIGH (ref 0–99)
NonHDL: 138.68
Total CHOL/HDL Ratio: 3
Triglycerides: 93 mg/dL (ref 0.0–149.0)
VLDL: 18.6 mg/dL (ref 0.0–40.0)

## 2023-11-22 LAB — HEMOGLOBIN A1C: Hgb A1c MFr Bld: 5.6 % (ref 4.6–6.5)

## 2023-11-22 LAB — COMPREHENSIVE METABOLIC PANEL WITH GFR
ALT: 19 U/L (ref 0–35)
AST: 21 U/L (ref 0–37)
Albumin: 4.2 g/dL (ref 3.5–5.2)
Alkaline Phosphatase: 70 U/L (ref 39–117)
BUN: 15 mg/dL (ref 6–23)
CO2: 30 meq/L (ref 19–32)
Calcium: 9.3 mg/dL (ref 8.4–10.5)
Chloride: 105 meq/L (ref 96–112)
Creatinine, Ser: 0.8 mg/dL (ref 0.40–1.20)
GFR: 77.13 mL/min (ref >60.00)
Glucose, Bld: 86 mg/dL (ref 70–99)
Potassium: 4 meq/L (ref 3.5–5.1)
Sodium: 141 meq/L (ref 135–145)
Total Bilirubin: 0.6 mg/dL (ref 0.2–1.2)
Total Protein: 6.7 g/dL (ref 6.0–8.3)

## 2023-11-22 LAB — TSH: TSH: 0.35 u[IU]/mL (ref 0.35–5.50)

## 2023-11-22 NOTE — Progress Notes (Signed)
 Labs stable.  Will discuss more at appt on 6/4

## 2023-11-29 ENCOUNTER — Encounter: Payer: Self-pay | Admitting: Family Medicine

## 2023-11-29 ENCOUNTER — Ambulatory Visit (INDEPENDENT_AMBULATORY_CARE_PROVIDER_SITE_OTHER): Payer: BC Managed Care – PPO | Admitting: Family Medicine

## 2023-11-29 ENCOUNTER — Other Ambulatory Visit (HOSPITAL_BASED_OUTPATIENT_CLINIC_OR_DEPARTMENT_OTHER): Payer: Self-pay

## 2023-11-29 VITALS — BP 106/64 | HR 66 | Temp 97.3°F | Resp 18 | Ht 64.5 in | Wt 161.2 lb

## 2023-11-29 DIAGNOSIS — Z0000 Encounter for general adult medical examination without abnormal findings: Secondary | ICD-10-CM | POA: Diagnosis not present

## 2023-11-29 DIAGNOSIS — R7303 Prediabetes: Secondary | ICD-10-CM

## 2023-11-29 DIAGNOSIS — E034 Atrophy of thyroid (acquired): Secondary | ICD-10-CM | POA: Diagnosis not present

## 2023-11-29 MED ORDER — METFORMIN HCL 500 MG PO TABS: 500.0000 mg | ORAL_TABLET | Freq: Every day | ORAL | 1 refills | Status: DC

## 2023-11-29 MED ORDER — ZEPBOUND 7.5 MG/0.5ML ~~LOC~~ SOAJ
7.5000 mg | SUBCUTANEOUS | 5 refills | Status: DC
Start: 2023-11-29 — End: 2024-04-23
  Filled 2023-11-29 – 2023-12-24 (×2): qty 2, 28d supply, fill #0
  Filled 2024-02-09: qty 2, 28d supply, fill #1
  Filled 2024-04-01: qty 2, 28d supply, fill #2

## 2023-11-29 NOTE — Patient Instructions (Signed)
 It was very nice to see you today!  Call GI and verify  [phone] when due.     PLEASE NOTE:  If you had any lab tests please let us  know if you have not heard back within a few days. You may see your results on MyChart before we have a chance to review them but we will give you a call once they are reviewed by us . If we ordered any referrals today, please let us  know if you have not heard from their office within the next week.   Please try these tips to maintain a healthy lifestyle:  Eat most of your calories during the day when you are active. Eliminate processed foods including packaged sweets (pies, cakes, cookies), reduce intake of potatoes, white bread, white pasta, and white rice. Look for whole grain options, oat flour or almond flour.  Each meal should contain half fruits/vegetables, one quarter protein, and one quarter carbs (no bigger than a computer mouse).  Cut down on sweet beverages. This includes juice, soda, and sweet tea. Also watch fruit intake, though this is a healthier sweet option, it still contains natural sugar! Limit to 3 servings daily.  Drink at least 1 glass of water with each meal and aim for at least 8 glasses per day  Exercise at least 150 minutes every week.

## 2023-11-29 NOTE — Progress Notes (Signed)
 Phone [phone]   Subjective:   Patient is a 68 y.o. female presenting for annual physical.    Chief Complaint  Patient presents with   Annual Exam    CPE, 6 month follow-up Not fasting, had blood work done last week   Annual-exercising Thy, hld,predm, working on diet/exercise Discussed the use of AI scribe software for clinical note transcription with the patient, who gave verbal consent to proceed.  History of Present Illness Wendy Payne is a 68 year old female who presents for an annual physical exam.  She maintains an active lifestyle, walking 12 to 15 miles a week. She is going to have cataract surgery on both eyes in July, which has affected her night vision, limiting her ability to drive at night.  She is vigilant about her colonoscopy schedule due to her family history of colon cancer. Her last colonoscopy in September 2022 revealed two or three non-cancerous polyps. Her father died of colon cancer, and her niece died of cancer at 64. She typically undergoes colonoscopies every five years but is considering a three-year interval vs 5.  She is on thyroid  medication, with recent blood work showing levels within range but on the higher side. She has no symptoms of hyperthyroidism such as shakiness or heart palpitations.  Her cholesterol is managed through diet and exercise, with an LDL level of 120. She is prediabetic and takes Zepbound  7.5 mg weekly. She has noticed a slight weight increase.  She has a history of osteopenia, likely due to a hysterectomy at a young age. She takes vitamin D  daily and ensures adequate calcium intake through her diet. She walks regularly and acknowledges the need for weight-bearing exercises.  She experiences chronic constipation and takes one Colace daily, which she feels is not significantly effective. She increases her water intake in the summer to help manage this issue.     See problem oriented charting- ROS- ROS: Gen: no fever,  chills  Skin: no rash, itching ENT: no ear pain, ear drainage, nasal congestion, rhinorrhea, sinus pressure, sore throat Eyes: no blurry vision, double vision Resp: no cough, wheeze,SOB CV: no CP, palpitations, LE edema,  GI: no heartburn, n/v/d/, abd pain.  Chronic constipation.  Colace daily GU: no dysuria, urgency, frequency, hematuria MSK: no joint pain, myalgias, back pain Neuro: no dizziness, headache, weakness, vertigo Psych: no depression, anxiety, insomnia, SI   The following were reviewed and entered/updated in epic: Past Medical History:  Diagnosis Date   Arthritis one year ago   Blood transfusion without reported diagnosis    Colon polyps    Hypothyroidism    Uterine cancer [facility])    age 89   Patient Active Problem List   Diagnosis Date Noted   Prediabetes 06/15/2022   Family history of colon cancer in father 08/17/2020   Hyperlipidemia-ASCVD risk 3.6% 08/19/2019   Hypothyroidism 08/16/2019   History of uterine cancer 08/16/2019   Past Surgical History:  Procedure Laterality Date   ABDOMINAL HYSTERECTOMY  Thirty-five- years ago   COLONOSCOPY  12/25/2015   in [place], [place] polyp-adenoma   hysterectomy and oopherectomy  Bilateral 1983   age 63 years.  endomet ca    Family History  Problem Relation Age of Onset   Diabetes Mother    Cancer Mother 21       panc ca   Kidney disease Mother    Colon cancer Father 85   Cancer Father 72       colon   Colon polyps Brother  Breast cancer Maternal Aunt    Breast cancer Paternal Aunt    BRCA 1/2 Cousin    BRCA 1/2 Cousin    Cancer Niece 71       colon ca   Stomach cancer Niece    Rectal cancer Niece    Esophageal cancer Niece    Colon cancer Niece 41    Medications- reviewed and updated Current Outpatient Medications  Medication Sig Dispense Refill   Cholecalciferol (VITAMIN D ) 50 MCG (2000 UT) tablet Take 2,000 Units by mouth daily.     levothyroxine  (SYNTHROID ) 100 MCG tablet Take 1 tablet (100 mcg  total) by mouth daily. 90 tablet 3   ketorolac (ACULAR) 0.5 % ophthalmic solution Place 1 drop into the right eye 4 (four) times daily. (Patient not taking: Reported on 11/29/2023)     metFORMIN  (GLUCOPHAGE ) 500 MG tablet Take 1 tablet (500 mg total) by mouth daily with breakfast. 90 tablet 1   moxifloxacin (VIGAMOX) 0.5 % ophthalmic solution Place 1 drop into the right eye 4 (four) times daily. (Patient not taking: Reported on 11/29/2023)     prednisoLONE acetate (PRED FORTE) 1 % ophthalmic suspension Place 1 drop into the right eye 4 (four) times daily. (Patient not taking: Reported on 11/29/2023)     tirzepatide  (ZEPBOUND ) 7.5 MG/0.5ML Pen Inject 7.5 mg into the skin once a week. 2 mL 5   No current facility-administered medications for this visit.    Allergies-reviewed and updated No Known Allergies  Social History   Social History Narrative   2 adopted.  Retired annunities   Objective  Objective:  BP 106/64   Pulse 66   Temp (!) 97.3 F (36.3 C) (Temporal)   Resp 18   Ht 5' 4.5" (1.638 m)   Wt 161 lb 4 oz (73.1 kg)   SpO2 99%   BMI 27.25 kg/m  Physical Exam  Gen: WDWN NAD HEENT: NCAT, conjunctiva not injected, sclera nonicteric TM WNL B, OP moist, no exudates  NECK:  supple, no thyromegaly, no nodes, no carotid bruits CARDIAC: RRR, S1S2+, no murmur. DP 2+B LUNGS: CTAB. No wheezes ABDOMEN:  BS+, soft, NTND, No HSM, no masses EXT:  no edema MSK: no gross abnormalities. MS 5/5 all 4 NEURO: A&O x3.  CN II-XII intact.  PSYCH: normal mood. Good eye contact   Disc labs   Assessment and Plan   Health Maintenance counseling: 1. Anticipatory guidance: Patient counseled regarding regular dental exams q6 months, eye exams,  avoiding smoking and second hand smoke, limiting alcohol to 1 beverage per day, no illicit drugs.   2. Risk factor reduction:  Advised patient of need for regular exercise and diet rich and fruits and vegetables to reduce risk of heart attack and stroke.  Exercise- +.  Wt Readings from Last 3 Encounters:  11/29/23 161 lb 4 oz (73.1 kg)  05/29/23 160 lb (72.6 kg)  02/22/23 161 lb 4 oz (73.1 kg)   3. Immunizations/screenings/ancillary studies Immunization History  Administered Date(s) Administered   PFIZER(Purple Top)SARS-COV-2 Vaccination 10/03/2019, 10/28/2019, 06/11/2020   Tdap 10/11/2021   Health Maintenance Due  Topic Date Due   Colonoscopy  03/19/2024    4. Cervical cancer screening- hyst 5. Breast cancer screening-  mammogram utd 6. Colon cancer screening - pt will sch for sept 7. Skin cancer screening- advised regular sunscreen use. Denies worrisome, changing, or new skin lesions.  8. Birth control/STD check- n/a 9. Osteoporosis screening- done 2024 10. Smoking associated screening - non smoker  Wellness  examination  Prediabetes -     Zepbound ; Inject 7.5 mg into the skin once a week.  Dispense: 2 mL; Refill: 5 -     metFORMIN  HCl; Take 1 tablet (500 mg total) by mouth daily with breakfast.  Dispense: 90 tablet; Refill: 1  Hypothyroidism due to acquired atrophy of thyroid    Annual-antic guidance Assessment and Plan Assessment & Plan Thyroid  Disorder   Thyroid  levels are within range but nearing overmedication. No symptoms of tremors, palpitations, or tachycardia. Medication adjustment may be necessary to prevent overmedication. Explained that lower thyroid  levels require less medication, while higher levels require more. Take a whole tablet daily, but take half a tablet once a week to adjust medication levels.  Prediabetes   Managed with Zepbound  7.5 mg. Weight is 161 lbs. Prefers to continue current regimen for weight and prediabetes management. Weekly Zepbound  may aid in weight control before holidays and vacations. Continue Zepbound  7.5 mg as prescribed.  Hyperlipidemia   LDL cholesterol is 120 mg/dL, below the target of 161 mg/dL for her risk profile. HDL is high, providing protection. Current management with diet  and exercise is effective. Explained that the normal LDL target is less than 100 mg/dL for higher-risk patients, but less than 130 mg/dL is acceptable for her.  Osteopenia   Osteopenia likely related to early hysterectomy. Currently exercising and taking vitamin D  with adequate dietary calcium intake. No weight training currently. Discussed importance of weight-bearing exercises for bone health and recommended starting with light weights. Incorporate light weight training exercises, starting with 3-pound weights and progressing to 5-pound weights as tolerated.  Constipation   Chronic issue managed with daily Colace and increased water intake. No significant improvement with current regimen. Continue daily Colace and monitor water intake.  General Health Maintenance   Up to date with mammogram and bone density screening. No recent pneumonia or shingles vaccinations. Does not receive flu vaccinations. Discussed importance of pneumonia and shingles vaccinations for those over 50. Call GI to verify need for colonoscopy in September 2025. Consider shingles and pneumonia vaccinations as she is over 86 years old.  Follow-up   Routine follow-up planned to monitor ongoing health conditions and medication management. Schedule next physical exam in six months.    Recommended follow up: Return in about 6 months (around 05/30/2024) for chronic follow-up.  Lab/Order associations:n/a fasting  Ellsworth Haas, MD

## 2023-12-25 ENCOUNTER — Other Ambulatory Visit (HOSPITAL_BASED_OUTPATIENT_CLINIC_OR_DEPARTMENT_OTHER): Payer: Self-pay

## 2024-01-24 NOTE — Progress Notes (Deleted)
 01/24/2024 Wendy Payne [id number] [birth year]  Referring provider: Wendolyn Jenkins Jansky, MD Primary GI doctor: Dr. Federico (Dr. Teressa)  ASSESSMENT AND PLAN:  Personal history of tubular adenomatous polyps and family history and father age less than 60 03/19/2021 colonoscopy with personal history of polyps father with CRC in 68s, good prep, 4 polyps 2 to 4 mm descending colon transverse colon, TA and SS polyps, recall 3 years  Patient Care Team: Wendolyn Jenkins Jansky, MD as PCP - General (Family Medicine)  HISTORY OF PRESENT ILLNESS: 68 y.o. female with a past medical history listed below presents for evaluation of colonoscopy.   Previously known to Dr. Teressa last seen 03/19/2021 for colonoscopy, currently requesting Dr. Federico.  *** Discussed the use of AI scribe software for clinical note transcription with the patient, who gave verbal consent to proceed.  History of Present Illness            She  reports that she has never smoked. She has never used smokeless tobacco. She reports that she does not drink alcohol and does not use drugs.  RELEVANT GI HISTORY, IMAGING AND LABS: Results          CBC    Component Value Date/Time   WBC 7.0 10/20/2022 0826   RBC 5.00 10/20/2022 0826   HGB 15.0 10/20/2022 0826   HCT 43.6 10/20/2022 0826   PLT 323.0 10/20/2022 0826   MCV 87.2 10/20/2022 0826   MCHC 34.4 10/20/2022 0826   RDW 13.4 10/20/2022 0826   LYMPHSABS 2.2 10/20/2022 0826   MONOABS 0.6 10/20/2022 0826   EOSABS 0.1 10/20/2022 0826   BASOSABS 0.0 10/20/2022 0826   No results for input(s): HGB in the last 8760 hours.  CMP     Component Value Date/Time   NA 141 11/22/2023 0823   K 4.0 11/22/2023 0823   CL 105 11/22/2023 0823   CO2 30 11/22/2023 0823   GLUCOSE 86 11/22/2023 0823   BUN 15 11/22/2023 0823   CREATININE 0.80 11/22/2023 0823   CALCIUM 9.3 11/22/2023 0823   PROT 6.7 11/22/2023 0823   ALBUMIN 4.2 11/22/2023 0823   AST 21 11/22/2023 0823   ALT 19  11/22/2023 0823   ALKPHOS 70 11/22/2023 0823   BILITOT 0.6 11/22/2023 0823      Latest Ref Rng & Units 11/22/2023    8:23 AM 05/22/2023    8:35 AM 01/24/2023    8:14 AM  Hepatic Function  Total Protein 6.0 - 8.3 g/dL 6.7  6.6  6.7   Albumin 3.5 - 5.2 g/dL 4.2  4.2  4.2   AST 0 - 37 U/L 21  24  19    ALT 0 - 35 U/L 19  20  15    Alk Phosphatase 39 - 117 U/L 70  67  70   Total Bilirubin 0.2 - 1.2 mg/dL 0.6  0.7  0.7   Bilirubin, Direct 0.0 - 0.3 mg/dL   0.1       Current Medications:   Current Outpatient Medications (Endocrine & Metabolic):    levothyroxine  (SYNTHROID ) 100 MCG tablet, Take 1 tablet (100 mcg total) by mouth daily.   metFORMIN  (GLUCOPHAGE ) 500 MG tablet, Take 1 tablet (500 mg total) by mouth daily with breakfast.      Current Outpatient Medications (Other):    Cholecalciferol (VITAMIN D ) 50 MCG (2000 UT) tablet, Take 2,000 Units by mouth daily.   ketorolac (ACULAR) 0.5 % ophthalmic solution, Place 1 drop into the right eye 4 (  four) times daily. (Patient not taking: Reported on 11/29/2023)   moxifloxacin (VIGAMOX) 0.5 % ophthalmic solution, Place 1 drop into the right eye 4 (four) times daily. (Patient not taking: Reported on 11/29/2023)   prednisoLONE acetate (PRED FORTE) 1 % ophthalmic suspension, Place 1 drop into the right eye 4 (four) times daily. (Patient not taking: Reported on 11/29/2023)   tirzepatide  (ZEPBOUND ) 7.5 MG/0.5ML Pen, Inject 7.5 mg into the skin once a week.  Medical History:  Past Medical History:  Diagnosis Date   Arthritis one year ago   Blood transfusion without reported diagnosis    Colon polyps    Hypothyroidism    Uterine cancer [facility])    age 25   Allergies: No Known Allergies   Surgical History:  She  has a past surgical history that includes hysterectomy and oopherectomy  (Bilateral, 1983); Colonoscopy (12/25/2015); and Abdominal hysterectomy (Thirty-five- years ago). Family History:  Her family history includes BRCA 1/2 in her cousin  and cousin; Breast cancer in her maternal aunt and paternal aunt; Cancer (age of onset: 52) in her niece; Cancer (age of onset: 66) in her father; Cancer (age of onset: 51) in her mother; Colon cancer (age of onset: 30) in her niece; Colon cancer (age of onset: 52) in her father; Colon polyps in her brother; Diabetes in her mother; Esophageal cancer in her niece; Kidney disease in her mother; Rectal cancer in her niece; Stomach cancer in her niece.  REVIEW OF SYSTEMS  : All other systems reviewed and negative except where noted in the History of Present Illness.  PHYSICAL EXAM: There were no vitals taken for this visit. Physical Exam          Alan JONELLE Coombs, PA-C 12:28 PM

## 2024-01-29 ENCOUNTER — Ambulatory Visit: Admitting: Physician Assistant

## 2024-01-30 ENCOUNTER — Other Ambulatory Visit: Payer: Self-pay | Admitting: Family Medicine

## 2024-01-30 DIAGNOSIS — R7303 Prediabetes: Secondary | ICD-10-CM

## 2024-01-30 DIAGNOSIS — E039 Hypothyroidism, unspecified: Secondary | ICD-10-CM

## 2024-01-30 MED ORDER — LEVOTHYROXINE SODIUM 100 MCG PO TABS: 100.0000 ug | ORAL_TABLET | Freq: Every day | ORAL | 3 refills | Status: DC

## 2024-01-30 MED ORDER — METFORMIN HCL 500 MG PO TABS: 500.0000 mg | ORAL_TABLET | Freq: Every day | ORAL | 1 refills | Status: DC

## 2024-01-30 NOTE — Telephone Encounter (Signed)
 Copied from CRM [id number]. Topic: Clinical - Medication Refill >> Jan 30, 2024  1:02 PM Viola F wrote: Medication: metFORMIN  (GLUCOPHAGE ) 500 MG tablet [[id number]] levothyroxine  (SYNTHROID ) 100 MCG tablet [[id number]]  Has the patient contacted their pharmacy? Yes (Agent: If no, request that the patient contact the pharmacy for the refill. If patient does not wish to contact the pharmacy document the reason why and proceed with request.) (Agent: If yes, when and what did the pharmacy advise?)  This is the patient's preferred pharmacy:    [facility] [id number] - [place], [place] - [address] [place] [address] Phone: [phone] Fax: [phone]  Is this the correct pharmacy for this prescription? Yes If no, delete pharmacy and type the correct one.   Has the prescription been filled recently? Yes  Is the patient out of the medication? No  Has the patient been seen for an appointment in the last year OR does the patient have an upcoming appointment? Yes  Can we respond through MyChart? Yes  Agent: Please be advised that Rx refills may take up to 3 business days. We ask that you follow-up with your pharmacy.

## 2024-02-09 ENCOUNTER — Other Ambulatory Visit (HOSPITAL_BASED_OUTPATIENT_CLINIC_OR_DEPARTMENT_OTHER): Payer: Self-pay

## 2024-02-12 ENCOUNTER — Other Ambulatory Visit (HOSPITAL_BASED_OUTPATIENT_CLINIC_OR_DEPARTMENT_OTHER): Payer: Self-pay

## 2024-04-01 ENCOUNTER — Other Ambulatory Visit (HOSPITAL_BASED_OUTPATIENT_CLINIC_OR_DEPARTMENT_OTHER): Payer: Self-pay

## 2024-04-02 ENCOUNTER — Other Ambulatory Visit (HOSPITAL_COMMUNITY): Payer: Self-pay

## 2024-04-02 ENCOUNTER — Telehealth: Payer: Self-pay

## 2024-04-02 NOTE — Telephone Encounter (Signed)
 Pharmacy Patient Advocate Encounter  Received notification from [facility] that Prior Authorization for Zepbound  7.5MG /0.5ML pen-injectors  has been DENIED.  Full denial letter will be uploaded to the media tab. See denial reason below.    PA #/Case ID/Reference #: [id number]

## 2024-04-02 NOTE — Telephone Encounter (Signed)
 Pharmacy Patient Advocate Encounter   Received notification from [employer]base that prior authorization for Zepbound  7.5MG /0.5ML pen-injectors is required/requested.   Insurance verification completed.   The patient is insured through [facility] MEDICARE.   Per test claim: PA required; PA submitted to above mentioned insurance via Latent Key/confirmation #/EOC [id number] Status is pending

## 2024-04-03 ENCOUNTER — Other Ambulatory Visit (HOSPITAL_BASED_OUTPATIENT_CLINIC_OR_DEPARTMENT_OTHER): Payer: Self-pay

## 2024-04-23 ENCOUNTER — Encounter: Payer: Self-pay | Admitting: Family Medicine

## 2024-04-23 ENCOUNTER — Ambulatory Visit: Payer: Self-pay | Admitting: Family Medicine

## 2024-04-23 VITALS — BP 118/64 | HR 72 | Temp 97.2°F | Wt 158.5 lb

## 2024-04-23 DIAGNOSIS — E039 Hypothyroidism, unspecified: Secondary | ICD-10-CM

## 2024-04-23 DIAGNOSIS — Z6826 Body mass index (BMI) 26.0-26.9, adult: Secondary | ICD-10-CM | POA: Diagnosis not present

## 2024-04-23 DIAGNOSIS — R7303 Prediabetes: Secondary | ICD-10-CM | POA: Diagnosis not present

## 2024-04-23 DIAGNOSIS — E663 Overweight: Secondary | ICD-10-CM | POA: Diagnosis not present

## 2024-04-23 MED ORDER — ZEPBOUND 7.5 MG/0.5ML ~~LOC~~ SOLN: 7.5000 mg | SUBCUTANEOUS | 5 refills | Status: AC

## 2024-04-23 NOTE — Progress Notes (Signed)
 Subjective:     Patient ID: Wendy Payne, female    DOB: 06/25/1956, 68 y.o.   MRN: [id number]  Chief Complaint  Patient presents with   Hypothyroidism    Pt is here for 6 month follow up, would like to discuss colonscopy and also needs refill on Zepbound     Discussed the use of AI scribe software for clinical note transcription with the patient, who gave verbal consent to proceed.  History of Present Illness Wendy Payne is a 68 year old female who presents for a routine six-month follow-up visit.  She underwent cataract surgery on both eyes in September, specifically on September 8th and September 22nd. She completed the prescribed eye drops and reports that she can see both up close and at a distance without the need for glasses or contacts.  She has been experiencing issues with obtaining her Zepbound  medication due to a change in insurance following her husband's retirement. Previously, she was able to purchase it at a discounted rate with commercial insurance, but now under Medicare, she is required to pay a higher price. She takes Zepbound  every two weeks and finds it beneficial for her wt and  sleep.  She has a history of prediabetes and is currently taking metformin . She has successfully lost weight and her blood sugar levels are now low. She also takes Synthroid  six days a week, omitting one day, and is satisfied with her current regimen.  She mentions experiencing left hip pain, which she attributes to arthritis, a condition that runs in her family. She manages the pain with ibuprofen and regular walking.  She has a history of colonoscopies every three years due to a family history of colon cancer. Her father and niece had colon cancer, which initially prompted the frequent screenings.    Health Maintenance Due  Topic Date Due   Medicare Annual Wellness (AWV)  Never done   Colonoscopy  03/19/2024    Past Medical History:  Diagnosis Date   Arthritis one year  ago   Blood transfusion without reported diagnosis    Colon polyps    Hypothyroidism    Uterine cancer [facility])    age 73    Past Surgical History:  Procedure Laterality Date   ABDOMINAL HYSTERECTOMY  Thirty-five- years ago   CATARACT EXTRACTION Bilateral    sept 2025   COLONOSCOPY  12/25/2015   in [place], [place] polyp-adenoma   hysterectomy and oopherectomy  Bilateral 1983   age 28 years.  endomet ca     Current Outpatient Medications:    Cholecalciferol (VITAMIN D ) 50 MCG (2000 UT) tablet, Take 2,000 Units by mouth daily., Disp: , Rfl:    levothyroxine  (SYNTHROID ) 100 MCG tablet, Take 1 tablet (100 mcg total) by mouth daily., Disp: 90 tablet, Rfl: 3   metFORMIN  (GLUCOPHAGE ) 500 MG tablet, Take 1 tablet (500 mg total) by mouth daily with breakfast., Disp: 90 tablet, Rfl: 1   tirzepatide  (ZEPBOUND ) 7.5 MG/0.5ML injection vial, Inject 7.5 mg into the skin once a week., Disp: 2 mL, Rfl: 5  No Known Allergies ROS neg/noncontributory except as noted HPI/below      Objective:     BP 118/64 (BP Location: Left Arm, Patient Position: Sitting, Cuff Size: Normal)   Pulse 72   Temp (!) 97.2 F (36.2 C) (Temporal)   Wt 158 lb 8 oz (71.9 kg)   SpO2 98%   BMI 26.79 kg/m  Wt Readings from Last 3 Encounters:  04/23/24 158 lb 8 oz (  71.9 kg)  11/29/23 161 lb 4 oz (73.1 kg)  05/29/23 160 lb (72.6 kg)    Physical Exam GENERAL: Well developed, well nourished, no acute distress. HEAD EYES EARS NOSE THROAT: Normocephalic, atraumatic, conjunctiva not injected, sclera nonicteric. CARDIAC: Regular rate and rhythm, S1 S2 present, no murmur, dorsalis pedis 2 plus bilaterally, heart normal. NECK: Supple, no thyromegaly, no nodes, no carotid bruits. LUNGS: Clear to auscultation bilaterally, no wheezes, lungs normal. ABDOMEN: Bowel sounds present, soft, non-tender, non-distended, no hepatosplenomegaly, no masses. EXTREMITIES: No edema. MUSCULOSKELETAL: No gross abnormalities. NEUROLOGICAL:  Alert and oriented x3, cranial nerves II through XII intact. PSYCHIATRIC: Normal mood, good eye contact.       Assessment & Plan:  Acquired hypothyroidism  Prediabetes  Overweight with body mass index (BMI) of 26 to 26.9 in adult  Other orders -     Zepbound ; Inject 7.5 mg into the skin once a week.  Dispense: 2 mL; Refill: 5    Assessment and Plan Assessment & Plan Obesity   Obesity management is complicated by insurance changes due to her husband's retirement and transition to Medicare, affecting access to Zepbound . Send a prescription to [facility] for Zepbound  vials. Discuss [phone] as an alternative, noting its different tolerability and side effects. Consider self-pay options for weight management medications.  Prediabetes   Her prediabetes is managed with metformin , showing successful weight loss and improved A1c levels, which remain below the diabetic range. Continue metformin  at the current dose and monitor A1c levels as needed.  Hypothyroidism   Hypothyroidism is well-managed with Synthroid , taken six days a week. Continue Synthroid  at the current dosing regimen.  Unilateral primary osteoarthritis, left hip   Chronic left hip pain, likely due to osteoarthritis, is managed with ibuprofen and stretching exercises. Cold weather and family history may exacerbate symptoms. Continue ibuprofen for pain management, perform stretching exercises and hip circles, and monitor symptoms. Consider physical therapy if pain worsens.  History of cataracts, status post bilateral cataract surgery   Bilateral cataract surgery was successfully completed in September with no current vision issues.     Return in about 6 months (around 10/22/2024) for annual physical-26m me.  awv w/Tina.  Jenkins CHRISTELLA Carrel, MD

## 2024-04-23 NOTE — Patient Instructions (Signed)
 It was very nice to see you today!  Wegovy.    PLEASE NOTE:  If you had any lab tests please let us  know if you have not heard back within a few days. You may see your results on MyChart before we have a chance to review them but we will give you a call once they are reviewed by us . If we ordered any referrals today, please let us  know if you have not heard from their office within the next week.   Please try these tips to maintain a healthy lifestyle:  Eat most of your calories during the day when you are active. Eliminate processed foods including packaged sweets (pies, cakes, cookies), reduce intake of potatoes, white bread, white pasta, and white rice. Look for whole grain options, oat flour or almond flour.  Each meal should contain half fruits/vegetables, one quarter protein, and one quarter carbs (no bigger than a computer mouse).  Cut down on sweet beverages. This includes juice, soda, and sweet tea. Also watch fruit intake, though this is a healthier sweet option, it still contains natural sugar! Limit to 3 servings daily.  Drink at least 1 glass of water with each meal and aim for at least 8 glasses per day  Exercise at least 150 minutes every week.

## 2024-04-30 ENCOUNTER — Other Ambulatory Visit (HOSPITAL_BASED_OUTPATIENT_CLINIC_OR_DEPARTMENT_OTHER): Payer: Self-pay

## 2024-04-30 ENCOUNTER — Other Ambulatory Visit: Payer: Self-pay

## 2024-04-30 DIAGNOSIS — E039 Hypothyroidism, unspecified: Secondary | ICD-10-CM

## 2024-04-30 MED ORDER — LEVOTHYROXINE SODIUM 100 MCG PO TABS
100.0000 ug | ORAL_TABLET | Freq: Every day | ORAL | 3 refills | Status: AC
  Filled 2024-04-30: qty 90, 90d supply, fill #0

## 2024-06-04 ENCOUNTER — Ambulatory Visit: Admitting: Family Medicine

## 2024-07-24 ENCOUNTER — Ambulatory Visit: Admitting: Internal Medicine

## 2024-07-26 ENCOUNTER — Other Ambulatory Visit: Payer: Self-pay | Admitting: Family Medicine

## 2024-07-26 DIAGNOSIS — R7303 Prediabetes: Secondary | ICD-10-CM

## 2024-08-21 ENCOUNTER — Ambulatory Visit: Admitting: Internal Medicine

## 2024-10-22 ENCOUNTER — Encounter: Admitting: Family Medicine
